# Patient Record
Sex: Male | Born: 1965 | Race: White | Hispanic: No | Marital: Single | Smoking: Light tobacco smoker
Health system: Southern US, Community
[De-identification: ages and names within clinical notes are randomized; demographics above are authoritative.]

## PROBLEM LIST (undated history)

## (undated) DIAGNOSIS — F1910 Other psychoactive substance abuse, uncomplicated: Secondary | ICD-10-CM

## (undated) DIAGNOSIS — F1010 Alcohol abuse, uncomplicated: Secondary | ICD-10-CM

## (undated) DIAGNOSIS — M542 Cervicalgia: Secondary | ICD-10-CM

## (undated) DIAGNOSIS — S70321A Blister (nonthermal), right thigh, initial encounter: Secondary | ICD-10-CM

## (undated) DIAGNOSIS — L089 Local infection of the skin and subcutaneous tissue, unspecified: Secondary | ICD-10-CM

## (undated) HISTORY — PX: APPENDECTOMY: SHX54

---

## 2008-12-12 ENCOUNTER — Emergency Department (HOSPITAL_COMMUNITY): Admission: EM | Admit: 2008-12-12 | Discharge: 2008-12-12 | Payer: Self-pay | Admitting: Emergency Medicine

## 2011-03-22 LAB — CBC
MCHC: 32.7 g/dL (ref 30.0–36.0)
MCV: 98.5 fL (ref 78.0–100.0)
Platelets: 295 10*3/uL (ref 150–400)

## 2011-03-22 LAB — BASIC METABOLIC PANEL
BUN: 10 mg/dL (ref 6–23)
CO2: 29 mEq/L (ref 19–32)
Chloride: 105 mEq/L (ref 96–112)
Creatinine, Ser: 1.11 mg/dL (ref 0.4–1.5)

## 2011-03-22 LAB — DIFFERENTIAL
Basophils Relative: 0 % (ref 0–1)
Eosinophils Absolute: 0.2 10*3/uL (ref 0.0–0.7)
Eosinophils Relative: 3 % (ref 0–5)
Monocytes Relative: 12 % (ref 3–12)
Neutrophils Relative %: 39 % — ABNORMAL LOW (ref 43–77)

## 2013-11-23 ENCOUNTER — Encounter (HOSPITAL_COMMUNITY): Payer: Self-pay | Admitting: Emergency Medicine

## 2013-11-23 ENCOUNTER — Emergency Department (HOSPITAL_COMMUNITY): Payer: Self-pay

## 2013-11-23 ENCOUNTER — Emergency Department (HOSPITAL_COMMUNITY)
Admission: EM | Admit: 2013-11-23 | Discharge: 2013-11-23 | Disposition: A | Discharge status: Home / Self Care | Payer: Self-pay | Attending: Emergency Medicine | Admitting: Emergency Medicine

## 2013-11-23 DIAGNOSIS — R42 Dizziness and giddiness: Secondary | ICD-10-CM

## 2013-11-23 DIAGNOSIS — M542 Cervicalgia: Secondary | ICD-10-CM

## 2013-11-23 DIAGNOSIS — R209 Unspecified disturbances of skin sensation: Secondary | ICD-10-CM | POA: Insufficient documentation

## 2013-11-23 DIAGNOSIS — M4802 Spinal stenosis, cervical region: Secondary | ICD-10-CM | POA: Insufficient documentation

## 2013-11-23 DIAGNOSIS — F17200 Nicotine dependence, unspecified, uncomplicated: Secondary | ICD-10-CM | POA: Insufficient documentation

## 2013-11-23 HISTORY — DX: Cervicalgia: M54.2

## 2013-11-23 LAB — URINALYSIS, ROUTINE W REFLEX MICROSCOPIC
Bilirubin Urine: NEGATIVE
Hgb urine dipstick: NEGATIVE
Ketones, ur: NEGATIVE mg/dL
Nitrite: NEGATIVE
Protein, ur: NEGATIVE mg/dL
Specific Gravity, Urine: 1.022 (ref 1.005–1.030)
Urobilinogen, UA: 0.2 mg/dL (ref 0.0–1.0)

## 2013-11-23 LAB — CBC
MCH: 33.2 pg (ref 26.0–34.0)
Platelets: 336 10*3/uL (ref 150–400)
RBC: 4.52 MIL/uL (ref 4.22–5.81)
RDW: 12.4 % (ref 11.5–15.5)
WBC: 9.2 10*3/uL (ref 4.0–10.5)

## 2013-11-23 LAB — BASIC METABOLIC PANEL
CO2: 27 mEq/L (ref 19–32)
Calcium: 9.5 mg/dL (ref 8.4–10.5)
GFR calc Af Amer: >90 mL/min (ref >90)
Sodium: 140 mEq/L (ref 135–145)

## 2013-11-23 LAB — POCT I-STAT TROPONIN I: Troponin i, poc: 0 ng/mL (ref 0.00–0.08)

## 2013-11-23 MED ORDER — ONDANSETRON HCL 4 MG/2ML IJ SOLN
4.0000 mg | Freq: Once | INTRAMUSCULAR | Status: AC
  Administered 2013-11-23: 4 mg via INTRAVENOUS
  Filled 2013-11-23: qty 2

## 2013-11-23 MED ORDER — MORPHINE SULFATE 4 MG/ML IJ SOLN
4.0000 mg | Freq: Once | INTRAMUSCULAR | Status: AC
  Administered 2013-11-23: 4 mg via INTRAVENOUS
  Filled 2013-11-23: qty 1

## 2013-11-23 MED ORDER — GADOBENATE DIMEGLUMINE 529 MG/ML IV SOLN
15.0000 mL | Freq: Once | INTRAVENOUS | Status: AC
  Administered 2013-11-23: 15 mL via INTRAVENOUS

## 2013-11-23 MED ORDER — PROMETHAZINE HCL 25 MG PO TABS: 25.0000 mg | ORAL_TABLET | Freq: Four times a day (QID) | ORAL | Status: DC | PRN

## 2013-11-23 MED ORDER — OXYCODONE-ACETAMINOPHEN 5-325 MG PO TABS
2.0000 | ORAL_TABLET | Freq: Once | ORAL | Status: AC
  Administered 2013-11-23: 2 via ORAL
  Filled 2013-11-23: qty 2

## 2013-11-23 MED ORDER — DIAZEPAM 5 MG PO TABS: 5.0000 mg | ORAL_TABLET | Freq: Three times a day (TID) | ORAL | Status: DC | PRN

## 2013-11-23 MED ORDER — DIAZEPAM 5 MG/ML IJ SOLN
5.0000 mg | Freq: Once | INTRAMUSCULAR | Status: AC
  Administered 2013-11-23: 5 mg via INTRAVENOUS
  Filled 2013-11-23: qty 2

## 2013-11-23 MED ORDER — OXYCODONE-ACETAMINOPHEN 5-325 MG PO TABS: 1.0000 | ORAL_TABLET | ORAL | Status: DC | PRN

## 2013-11-23 MED ORDER — SODIUM CHLORIDE 0.9 % IV BOLUS (SEPSIS)
1000.0000 mL | Freq: Once | INTRAVENOUS | Status: AC
  Administered 2013-11-23: 1000 mL via INTRAVENOUS

## 2013-11-23 NOTE — ED Notes (Signed)
Spoke with MRI about delay- pt informed he would be transported in about 20 minutes.  Pt denies being claustrophobic.

## 2013-11-23 NOTE — ED Notes (Signed)
Pt reports for the past couple of weeks he has been having dizzy spells. Reports that he feels like his balance is off. No neuro deficits. Pt A&Ox4. Denies any new pain at this time.

## 2013-11-23 NOTE — ED Notes (Signed)
Neurology at bedside.

## 2013-11-23 NOTE — ED Notes (Signed)
Pt comfortable with d/c and f/u instructions. Prescriptions x3 

## 2013-11-23 NOTE — ED Provider Notes (Signed)
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 He states over the past 2 weeks he is also had intermittent vertiginous symptoms and feels it is very difficult for him to walk.  He states he has had intermittent hearing changes but denies urine loss, ear pain or discharge from his ears. Denies any recent head injury. He does have some intermittent numbness in both of his arms but this is mostly worse at night. No weakness. No bowel or bladder incontinence. He states it is very difficult for him to walk because of his symptoms. He states that his neck pain and vertigo are worse with rotation of his neck and extending his neck. He denies any fever or chills. He's had a diffuse, throbbing headache. No history of head injury. He is not on anticoagulation. He denies any chest pain, shortness of breath, palpitations. No lightheadedness. No vomiting or diarrhea. No bloody stool or melena.  ROS: See HPI Constitutional: no fever  Eyes: no drainage  ENT: no runny nose   Cardiovascular:  no chest pain  Resp: no SOB  GI: no vomiting GU: no dysuria Integumentary: no rash  Allergy: no hives  Musculoskeletal: no leg swelling  Neurological: no slurred speech ROS otherwise negative  PAST MEDICAL HISTORY/PAST SURGICAL HISTORY:  Past Medical History  Diagnosis Date  . Neck pain     MEDICATIONS:  Prior to Admission medications   Not on File    ALLERGIES:  No Known Allergies  SOCIAL HISTORY:  History  Substance Use Topics  . Smoking status: Current Every Day Smoker  . Smokeless tobacco: Not on file  . Alcohol Use: Yes    FAMILY HISTORY: History reviewed. No pertinent family history.  EXAM: BP 133/100  Pulse 52  Temp(Src) 98.1 F (36.7 C)  (Oral)  Resp 16  Ht 5\' 10"  (1.778 m)  Wt 201 lb (91.173 kg)  BMI 28.84 kg/m2  SpO2 99% CONSTITUTIONAL: Alert and oriented and responds appropriately to questions. Well-appearing; well-nourished HEAD: Normocephalic EYES: Conjunctivae clear, PERRL ENT: normal nose; no rhinorrhea; moist mucous membranes; pharynx without lesions noted, TMs are clear bilaterally NECK: Supple, no meningismus, no LAD; tender to palpation over his posterior neck diffusely with, no midline step-off or deformity, no carotid bruit CARD: RRR; S1 and S2 appreciated; no murmurs, no clicks, no rubs, no gallops RESP: Normal chest excursion without splinting or tachypnea; breath sounds clear and equal bilaterally; no wheezes, no rhonchi, no rales,  ABD/GI: Normal bowel sounds; non-distended; soft, non-tender, no rebound, no guarding BACK:  The back appears normal and is non-tender to palpation, there is no CVA tenderness EXT: Normal ROM in all joints; non-tender to palpation; no edema; normal capillary refill; no cyanosis    SKIN: Normal color for age and race; warm NEURO: Moves all extremities equally; nystagmus with horizontal eye movements that slowly fatigues after several minutes and is worse when looking towards the right, stumbling but not ataxic gait, strength 5/5 in all 4 extremities, sensation to light touch intact diffusely, diminished reflexes in bilateral upper and lower extremities, no clonus PSYCH: The patient's mood and manner are appropriate. Grooming and personal hygiene are appropriate.  MEDICAL DECISION MAKING: Patient here with vertiginous symptoms and neck pain. Symptoms may be consistent with  peripheral vertigo however given patient's not consistent neuro exam, will obtain MRI of his brain, cervical spine and MRA of his neck. Discussed with radiologist who agrees with these tests to rule out any vascular abnormality, cervical myelopathy, infarct. Patient's labs are unremarkable. Troponin negative. EKG  normal. Will give IV fluids, Zofran, Valium and reassess. Patient and family at bedside agree with this plan.  ED PROGRESS: Patient's MRA neck is unremarkable. His MRI brain shows nonspecific scattered punctate subcortical T2 hyperintensities that may be due to microvascular changes versus prior inflammatory infectious changes, possible multiple sclerosis, vasculitis, complicated migraine headaches. 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 Given he initially had some stumbling gait on exam and has this moderate stenosis, will discuss with neurosurgery on call. After receiving medications his gait has slightly improved. It is difficult to ascertain whether his gait abnormalities were from any possible cervical spine pathology versus his vertigo but he does have normal reflexes to slightly diminished reflexes on exam. There is no hyperreflexia or clonus.  Discussed with Dr. Venetia Maxon with neurosurgery who will follow the patient in his office. Also discussed with Dr. Cyril Mourning with neurology who will see the patient in the ED.  10:53 PM  Dr. Leroy Kennedy has seen the patient in the emergency department and feels his symptoms and imaging are inconsistent with multiple sclerosis. Recommends repeat outpatient MRI in 3 months. We'll discharge home with pain medication, Valium and Phenergan for vertigo. Given return precautions. Will give PCP followup information. Will give neurology and neurosurgery outpatient information. Patient verbalizes understanding and is comfortable with plan.    EKG Interpretation    Date/Time:  Friday November 23 2013 13:20:32 EST Ventricular Rate:  49 PR Interval:  180 QRS Duration: 94 QT Interval:  474 QTC Calculation: 428 R Axis:   72 Text Interpretation:  Sinus bradycardia Otherwise normal ECG No significant change since last tracing Confirmed by Jonael Paradiso  DO, Holden Maniscalco ([id number]) on 11/23/2013 3:17:40 PM                Layla Maw Rydge Texidor, DO 11/23/13 [id number]

## 2013-11-23 NOTE — ED Notes (Signed)
Patient transported to MRI 

## 2013-11-23 NOTE — Discharge Instructions (Signed)
Spinal Stenosis Spinal stenosis is an abnormal narrowing of the canals of your spine (vertebrae). CAUSES  Spinal stenosis is caused by areas of bone pushing into the central canals of your vertebrae. This condition can be present at birth (congenital). It also may be caused by arthritic deterioration of your vertebrae (spinal degeneration).  SYMPTOMS   Pain that is generally worse with activities, particularly standing and walking.  Numbness, tingling, hot or cold sensations, weakness, or weariness in your legs.  Frequent episodes of falling.  A foot-slapping gait that leads to muscle weakness. DIAGNOSIS  Spinal stenosis is diagnosed with the use of magnetic resonance imaging (MRI) or computed tomography (CT). TREATMENT  Initial therapy for spinal stenosis focuses on the management of the pain and other symptoms associated with the condition. These therapies include:  Practicing postural changes to lessen pressure on your nerves.  Exercises to strengthen the core of your body.  Loss of excess body weight.  The use of nonsteroidal anti-inflammatory medications to reduce swelling and inflammation in your nerves. When therapies to manage pain are not successful, surgery to treat spinal stenosis may be recommended. This surgery involves removing excess bone, which puts pressure on your nerve roots. During this surgery (laminectomy), the posterior boney arch (lamina) and excess bone around the facet joints are removed. Document Released: 02/12/2004 Document Revised: 03/19/2013 Document Reviewed: 03/02/2013 [facility] Patient Information 2014 [place], [place].  Vertigo Vertigo means you feel like you or your surroundings are moving when they are not. Vertigo can be dangerous if it occurs when you are at work, driving, or performing difficult activities.  CAUSES  Vertigo occurs when there is a conflict of signals sent to your brain from the visual and sensory systems in your body. There are  many different causes of vertigo, including:  Infections, especially in the inner ear.  A bad reaction to a drug or misuse of alcohol and medicines.  Withdrawal from drugs or alcohol.  Rapidly changing positions, such as lying down or rolling over in bed.  A migraine headache.  Decreased blood flow to the brain.  Increased pressure in the brain from a head injury, infection, tumor, or bleeding. SYMPTOMS  You may feel as though the world is spinning around or you are falling to the ground. Because your balance is upset, vertigo can cause nausea and vomiting. You may have involuntary eye movements (nystagmus). DIAGNOSIS  Vertigo is usually diagnosed by physical exam. If the cause of your vertigo is unknown, your caregiver may perform imaging tests, such as an MRI scan (magnetic resonance imaging). TREATMENT  Most cases of vertigo resolve on their own, without treatment. Depending on the cause, your caregiver may prescribe certain medicines. If your vertigo is related to body position issues, your caregiver may recommend movements or procedures to correct the problem. In rare cases, if your vertigo is caused by certain inner ear problems, you may need surgery. HOME CARE INSTRUCTIONS   Follow your caregiver's instructions.  Avoid driving.  Avoid operating heavy machinery.  Avoid performing any tasks that would be dangerous to you or others during a vertigo episode.  Tell your caregiver if you notice that certain medicines seem to be causing your vertigo. Some of the medicines used to treat vertigo episodes can actually make them worse in some people. SEEK IMMEDIATE MEDICAL CARE IF:   Your medicines do not relieve your vertigo or are making it worse.  You develop problems with talking, walking, weakness, or using your arms, hands, or legs.  You develop severe headaches.  Your nausea or vomiting continues or gets worse.  You develop visual changes.  A family member notices  behavioral changes.  Your condition gets worse. MAKE SURE YOU:  Understand these instructions.  Will watch your condition.  Will get help right away if you are not doing well or get worse. Document Released: 09/01/2005 Document Revised: 02/14/2012 Document Reviewed: 06/10/2011 [facility] Patient Information 2014 [place], [place].

## 2013-11-23 NOTE — Consult Note (Signed)
NEURO HOSPITALIST CONSULT NOTE    Reason for Consult: vertigo, dysequilibrium, neck pain  HPI:                                                                                                                                          Matthew Bryant is an 47 y.o. male with a past medical history significant for chronic episodic vertigo, chronic neck pain, comes in today for evaluation of the above stated symptoms. Matthew Bryant stated that he started having " dizziness" couple of years ago. He said that prior attacks of dizziness will last only for few days, but this time the dizziness has being of and ion for 3 weeks and got so severe today that he decided to come to the ED. He indicated that the dizziness is triggered by head/neck movement or changes in position and he gets very off balance during the attack. No nausea, vomiting, double vision, focal weakness, slurred speech, confusion, language or vision impairment. No bladder or bowel impairment, muscle cramps. He complains of daily, severe neck pain that travels to his arms and is also associated with numbness in the same distribution. No recent head or neck trauma, fever, skin rash, or infection. Matthew Bryant said that at some point in the past he did therapy that helped the vertigo. His symptoms prompted MRI brain tonight that showed non specific scattered punctate subcortical T2 hyperintensities are slightly  MRA neck unremarkable. 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    Past Medical History  Diagnosis Date  . Neck pain     History reviewed. No pertinent past surgical history.  History reviewed. No pertinent family history.   Social History:  reports that he has been smoking.  He does not have any smokeless tobacco history on file. He reports that he drinks alcohol. He reports that he uses illicit drugs (Marijuana).  No Known Allergies  MEDICATIONS:                                                                                                                      I have reviewed the patient's current medications.   ROS:  History obtained from the patient  General ROS: negative for - chills, fatigue, fever, night sweats, weight gain or weight loss Psychological ROS: negative for - behavioral disorder, hallucinations, memory difficulties, mood swings or suicidal ideation Ophthalmic ROS: negative for - blurry vision, double vision, eye pain or loss of vision ENT ROS: negative for - epistaxis, nasal discharge, oral lesions, sore throat, tinnitus or vertigo Allergy and Immunology ROS: negative for - hives or itchy/watery eyes Hematological and Lymphatic ROS: negative for - bleeding problems, bruising or swollen lymph nodes Endocrine ROS: negative for - galactorrhea, hair pattern changes, polydipsia/polyuria or temperature intolerance Respiratory ROS: negative for - cough, hemoptysis, shortness of breath or wheezing Cardiovascular ROS: negative for - chest pain, dyspnea on exertion, edema or irregular heartbeat Gastrointestinal ROS: negative for - abdominal pain, diarrhea, hematemesis, nausea/vomiting or stool incontinence Genito-Urinary ROS: negative for - dysuria, hematuria, incontinence or urinary frequency/urgency Musculoskeletal ROS: negative for - joint swelling or muscular weakness Neurological ROS: as noted in HPI Dermatological ROS: negative for rash and skin lesion changes   Physical exam: pleasant male in no apparent distress. Blood pressure 120/82, pulse 57, temperature 98.1 F (36.7 C), temperature source Oral, resp. rate 18, height 5\' 10"  (1.778 m), weight 91.173 kg (201 lb), SpO2 99.00%. Head: normocephalic. Neck: tender, decreased range of motion, no bruits, no JVD. Cardiac: no murmurs. Lungs:  clear. Abdomen: soft, no tender, no mass. Extremities: no edema.   Neurologic Examination:                                                                                                      Mental Status: Alert, oriented, thought content appropriate.  Speech fluent without evidence of aphasia.  Able to follow 3 step commands without difficulty. Cranial Nerves: II: Discs flat bilaterally; Visual fields grossly normal, pupils equal, round, reactive to light and accommodation III,IV, VI: ptosis not present, extra-ocular motions intact bilaterally V,VII: smile symmetric, facial light touch sensation normal bilaterally VIII: hearing normal bilaterally IX,X: gag reflex present XI: bilateral shoulder shrug XII: midline tongue extension without atrophy or fasciculations  Motor: Right : Upper extremity   5/5    Left:     Upper extremity   5/5  Lower extremity   5/5     Lower extremity   5/5 Tone and bulk:normal tone throughout; no atrophy noted Sensory: Pinprick and light touch intact throughout, bilaterally Deep Tendon Reflexes:  Right: Upper Extremity   Left: Upper extremity   biceps (C-5 to C-6) 2/4   biceps (C-5 to C-6) 2/4 tricep (C7) 2/4    triceps (C7) 2/4 Brachioradialis (C6) 2/4  Brachioradialis (C6) 2/4  Lower Extremity Lower Extremity  quadriceps (L-2 to L-4) 2/4   quadriceps (L-2 to L-4) 2/4 Achilles (S1) 2/4   Achilles (S1) 2/4  Plantars: Right: downgoing   Left: downgoing Cerebellar: normal finger-to-nose,  normal heel-to-shin test Gait:  No ataxia. CV: pulses palpable throughout    No results found for this basename: cbc, bmp, coags, chol, tri, ldl, hga1c    Results for orders placed during the hospital encounter of  11/23/13 (from the past 48 hour(s))  CBC     Status: None   Collection Time    11/23/13  1:27 PM      Result Value Range   WBC 9.2  4.0 - 10.5 K/uL   RBC 4.52  4.22 - 5.81 MIL/uL   Hemoglobin 15.0  13.0 - 17.0 g/dL   HCT 16.1  09.6 - 04.5 %    MCV 96.9  78.0 - 100.0 fL   MCH 33.2  26.0 - 34.0 pg   MCHC 34.2  30.0 - 36.0 g/dL   RDW 40.9  81.1 - 91.4 %   Platelets 336  150 - 400 K/uL  BASIC METABOLIC PANEL     Status: Abnormal   Collection Time    11/23/13  1:27 PM      Result Value Range   Sodium 140  135 - 145 mEq/L   Potassium 4.0  3.5 - 5.1 mEq/L   Chloride 104  96 - 112 mEq/L   CO2 27  19 - 32 mEq/L   Glucose, Bld 80  70 - 99 mg/dL   BUN 11  6 - 23 mg/dL   Creatinine, Ser 7.82  0.50 - 1.35 mg/dL   Calcium 9.5  8.4 - 95.6 mg/dL   GFR calc non Af Amer 83 (*) >90 mL/min   GFR calc Af Amer >90  >90 mL/min   Comment: (NOTE)     The eGFR has been calculated using the CKD EPI equation.     This calculation has not been validated in all clinical situations.     eGFR's persistently <90 mL/min signify possible Chronic Kidney     Disease.  POCT I-STAT TROPONIN I     Status: None   Collection Time    11/23/13  1:38 PM      Result Value Range   Troponin i, poc 0.00  0.00 - 0.08 ng/mL   Comment 3            Comment: Due to the release kinetics of cTnI,     a negative result within the first hours     of the onset of symptoms does not rule out     myocardial infarction with certainty.     If myocardial infarction is still suspected,     repeat the test at appropriate intervals.  URINALYSIS, ROUTINE W REFLEX MICROSCOPIC     Status: None   Collection Time    11/23/13  7:21 PM      Result Value Range   Color, Urine YELLOW  YELLOW   APPearance CLEAR  CLEAR   Specific Gravity, Urine 1.022  1.005 - 1.030   pH 6.0  5.0 - 8.0   Glucose, UA NEGATIVE  NEGATIVE mg/dL   Hgb urine dipstick NEGATIVE  NEGATIVE   Bilirubin Urine NEGATIVE  NEGATIVE   Ketones, ur NEGATIVE  NEGATIVE mg/dL   Protein, ur NEGATIVE  NEGATIVE mg/dL   Urobilinogen, UA 0.2  0.0 - 1.0 mg/dL   Nitrite NEGATIVE  NEGATIVE   Leukocytes, UA NEGATIVE  NEGATIVE   Comment: MICROSCOPIC NOT DONE ON URINES WITH NEGATIVE PROTEIN, BLOOD, LEUKOCYTES, NITRITE, OR GLUCOSE  <1000 mg/dL.    Mr Angiogram Neck W Wo Contrast  11/23/2013   CLINICAL DATA:  Neck pain and radiculopathy. Vertigo. Pre syncopal sensation. CT head without contrast  EXAM: MRI HEAD WITHOUT AND WITH CONTRAST  MRA NECK WITHOUT AND WITH CONTRAST  TECHNIQUE: Multiplanar, multiecho pulse sequences of the brain and  surrounding structures were obtained without and with intravenous contrast. Angiographic images of the neck were obtained using MRA technique without and with contrast.  CONTRAST:  15mL MULTIHANCE GADOBENATE DIMEGLUMINE 529 MG/ML IV SOLN  COMPARISON:  CT head 12/12/2008  FINDINGS: MRI HEAD FINDINGS  The diffusion-weighted images demonstrate no evidence for acute or subacute infarction. Midline structures are within normal limits. Scattered punctate subcortical T2 hyperintensities are slightly greater than anticipated for age. No hemorrhage or mass lesion is present. The ventricles are of normal size. No significant extra-axial fluid collection is present.  Flow is present in the major intracranial arteries. The globes and orbits are intact.  A fluid level is present in the right maxillary sinus. Circumferential mucosal thickening is noted in the left maxillary sinus. Scattered mucosal disease is present throughout the ethmoid air cells and bilateral frontal sinuses. The mastoid air cells are clear.  MRA NECK FINDINGS  The time-of-flight images demonstrate no significant flow disturbance at either carotid bifurcation. Flow is antegrade in the vertebral arteries bilaterally.  May postcontrast images demonstrate a standard 3 vessel arch configuration. The vertebral arteries originate from the subclavian arteries bilaterally. Signal loss at the origin of the right vertebral artery is felt to be artifactual. The left vertebral artery is slightly dominant. There are no focal stenoses.  The right common carotid artery is within normal limits. The bifurcation is unremarkable. The cervical right internal carotid  artery is normal.  The left common carotid artery is within normal limits. The bifurcation is unremarkable. The cervical left ICA is normal.  IMPRESSION: 1. No acute intracranial abnormality. 2. Scattered subcortical T2 hyperintensities are slightly greater than expected for age. The finding is nonspecific but can be seen in the setting of chronic microvascular ischemia, a demyelinating process such as multiple sclerosis, vasculitis, complicated migraine headaches, or as the sequelae of a prior infectious or inflammatory process. 3. Diffuse sinus disease with a fluid level in the right maxillary sinus. 4. Negative MRA of the neck.   Electronically Signed   By: Gennette Pac M.D.   On: 11/23/2013 19:00   Mr Laqueta Jean ZO Contrast  11/23/2013   CLINICAL DATA:  Neck pain and radiculopathy. Vertigo. Pre syncopal sensation. CT head without contrast  EXAM: MRI HEAD WITHOUT AND WITH CONTRAST  MRA NECK WITHOUT AND WITH CONTRAST  TECHNIQUE: Multiplanar, multiecho pulse sequences of the brain and surrounding structures were obtained without and with intravenous contrast. Angiographic images of the neck were obtained using MRA technique without and with contrast.  CONTRAST:  15mL MULTIHANCE GADOBENATE DIMEGLUMINE 529 MG/ML IV SOLN  COMPARISON:  CT head 12/12/2008  FINDINGS: MRI HEAD FINDINGS  The diffusion-weighted images demonstrate no evidence for acute or subacute infarction. Midline structures are within normal limits. Scattered punctate subcortical T2 hyperintensities are slightly greater than anticipated for age. No hemorrhage or mass lesion is present. The ventricles are of normal size. No significant extra-axial fluid collection is present.  Flow is present in the major intracranial arteries. The globes and orbits are intact.  A fluid level is present in the right maxillary sinus. Circumferential mucosal thickening is noted in the left maxillary sinus. Scattered mucosal disease is present throughout the ethmoid air  cells and bilateral frontal sinuses. The mastoid air cells are clear.  MRA NECK FINDINGS  The time-of-flight images demonstrate no significant flow disturbance at either carotid bifurcation. Flow is antegrade in the vertebral arteries bilaterally.  May postcontrast images demonstrate a standard 3 vessel arch configuration. The vertebral arteries originate  from the subclavian arteries bilaterally. Signal loss at the origin of the right vertebral artery is felt to be artifactual. The left vertebral artery is slightly dominant. There are no focal stenoses.  The right common carotid artery is within normal limits. The bifurcation is unremarkable. The cervical right internal carotid artery is normal.  The left common carotid artery is within normal limits. The bifurcation is unremarkable. The cervical left ICA is normal.  IMPRESSION: 1. No acute intracranial abnormality. 2. Scattered subcortical T2 hyperintensities are slightly greater than expected for age. The finding is nonspecific but can be seen in the setting of chronic microvascular ischemia, a demyelinating process such as multiple sclerosis, vasculitis, complicated migraine headaches, or as the sequelae of a prior infectious or inflammatory process. 3. Diffuse sinus disease with a fluid level in the right maxillary sinus. 4. Negative MRA of the neck.   Electronically Signed   By: Gennette Pac M.D.   On: 11/23/2013 19:00   Mr Cervical Spine Wo Contrast  11/23/2013   CLINICAL DATA:  Neck pain and right radiculopathy.  EXAM: MRI CERVICAL SPINE WITHOUT CONTRAST  TECHNIQUE: Multiplanar, multisequence MR imaging was performed. No intravenous contrast was administered.  COMPARISON:  Normal signal is present on upper thoracic spinal cord to the lowest imaged level, T2-3. Marrow signal is slightly heterogeneous. Vertebral body heights and alignment are maintained. The craniocervical junction is within normal limits. Flow is present in the major vascular structures  of the neck.  C2-3:  Negative.  C3-4: Uncovertebral spurring is present bilaterally. Mild facet hypertrophy is noted as well. This results in mild foraminal narrowing on both sides.  C4-5: A broad-based disc osteophyte complex is asymmetric to the right. Asymmetric right-sided facet hypertrophy is present. Mild right central and moderate right foraminal stenosis is present.  C5-6: A broad-based disc osteophyte complex is present. The central canal is narrowed to 7.5 mm. Severe left and moderate right foraminal stenosis is secondary to uncovertebral and facet disease.  C6-7: A broad-based disc osteophyte complex partially effaces the ventral CSF. The canal is maintained at 9.5 mm. Mild to moderate left foraminal narrowing is due to uncovertebral and facet disease. Facet hypertrophy is present on the right without significant stenosis.  C7-T1: Mild facet hypertrophy is present bilaterally without significant stenosis.  FINDINGS: 1. Mild bilateral foraminal stenosis at C3-4. 2. Mild right central and moderate right foraminal stenosis at C4-5. 3. Moderate central canal stenosis at C5-6. 4. Severe left and moderate right foraminal stenosis at C5-6. 5. Mild to moderate left foraminal stenosis at C6-7. 6. Facet hypertrophy on the right at C6-7 and bilaterally at C7-T1 without significant stenosis.   Electronically Signed   By: Gennette Pac M.D.   On: 11/23/2013 19:04    Assessment/Plan: 47 y/o with chronic episodic isolated vertigo and non focal neuro-exam. MRI brain showed scattered but non specific subcortical T2 hyperintensities that do not meet criteria for demyelinating disease. I think this should be follow by MRI brain with and without contrast in 3 months and then decide about LP, but at this moment his clinical presentation and MRI findings are not consistent with MS and most likely are due to a peripheral vertigo. Neurosurgery was already contacted by the ED regarding his neck issues and I concur that no  aggressive intervention is needed at this moment.   Wyatt Portela, MD 11/23/2013, 10:00 PM [facility]

## 2013-11-23 NOTE — ED Notes (Addendum)
Wife states the pt is feeling worse and feels he may pass out. I told her i would come to check the pt VS and she refused. States "im keeping an eye on him but i hope hell see the doctor soon." explained triage process and wait to wife, she verbalized udnerstanding

## 2015-02-20 ENCOUNTER — Emergency Department (HOSPITAL_COMMUNITY)
Admission: EM | Admit: 2015-02-20 | Discharge: 2015-02-20 | Disposition: A | Discharge status: Home / Self Care | Payer: Self-pay | Attending: Emergency Medicine | Admitting: Emergency Medicine

## 2015-02-20 ENCOUNTER — Encounter (HOSPITAL_COMMUNITY): Payer: Self-pay

## 2015-02-20 DIAGNOSIS — K029 Dental caries, unspecified: Secondary | ICD-10-CM | POA: Insufficient documentation

## 2015-02-20 DIAGNOSIS — Z79899 Other long term (current) drug therapy: Secondary | ICD-10-CM | POA: Insufficient documentation

## 2015-02-20 DIAGNOSIS — K047 Periapical abscess without sinus: Secondary | ICD-10-CM | POA: Insufficient documentation

## 2015-02-20 DIAGNOSIS — Z720 Tobacco use: Secondary | ICD-10-CM | POA: Insufficient documentation

## 2015-02-20 MED ORDER — DOXYCYCLINE HYCLATE 100 MG PO CAPS: 100.0000 mg | ORAL_CAPSULE | Freq: Two times a day (BID) | ORAL | Status: DC

## 2015-02-20 MED ORDER — HYDROCODONE-ACETAMINOPHEN 5-325 MG PO TABS: 1.0000 | ORAL_TABLET | Freq: Four times a day (QID) | ORAL | Status: DC | PRN

## 2015-02-20 MED ORDER — HYDROCODONE-ACETAMINOPHEN 5-325 MG PO TABS
1.0000 | ORAL_TABLET | Freq: Once | ORAL | Status: AC
  Administered 2015-02-20: 1 via ORAL
  Filled 2015-02-20: qty 1

## 2015-02-20 MED ORDER — BENZOCAINE 20 % MT PSTE
1.0000 "application " | PASTE | Freq: Once | OROMUCOSAL | Status: AC
  Administered 2015-02-20: 1 via OROMUCOSAL
  Filled 2015-02-20: qty 11.9

## 2015-02-20 MED ORDER — NAPROXEN 500 MG PO TABS: 500.0000 mg | ORAL_TABLET | Freq: Two times a day (BID) | ORAL | Status: DC | PRN

## 2015-02-20 NOTE — ED Provider Notes (Signed)
CSN: [id number]     Arrival date & time 02/20/15  1147 History  This chart was scribed for non-physician practitioner, Allen Derry, PA-C, working with Tilden Fossa, MD, by Ronney Lion, ED Scribe. This patient was seen in room WTR5/WTR5 and the patient's care was started at 12:39 PM.    Chief Complaint  Patient presents with  . Dental Pain   Patient is a 49 y.o. male presenting with tooth pain. The history is provided by the patient, the spouse and medical records. No language interpreter was used.  Dental Pain Location:  Lower Lower teeth location:  20/LL 2nd bicuspid Quality:  Sharp (stabbing) Severity:  Severe (10/10) Onset quality:  Gradual Duration:  2 weeks Timing:  Constant Progression:  Worsening Chronicity:  New Context: abscess   Previous work-up:  Dental exam Relieved by:  Nothing Exacerbated by: cold air. Ineffective treatments:  Acetaminophen Associated symptoms: facial swelling (per wife, there is a knot in his left cheek), gum swelling and oral lesions (abscess)   Associated symptoms: no difficulty swallowing, no drooling, no fever, no headaches, no neck pain, no neck swelling and no trismus   Risk factors: smoking (per medical records, patient is a current smoker)      HPI Comments: Matthew Bryant is a 49 y.o. male who presents to the Emergency Department complaining of constant, 10/10, stabbing left lower dental pain radiating to his left ear that began 2 weeks ago. Patient states his pain is so severe that he tried to extract the tooth himself this morning. He took a partial course of leftover Amoxicillin, with an unknown expiry date, 5 days ago, which did not alleviate his pain. He called his dentist this morning, who told him he needed to see an oral Careers adviser. He has tried Tylenol, which was ineffective. Exposure to cold air makes it worse. Nothing makes it better. His wife also notes a "knot" in his left cheek/face. He denies any neck swelling, ear drainage,  gum drainage, drooling, trismus, fever, chest pain, SOB, abdominal pain, nausea, or vomiting.   Past Medical History  Diagnosis Date  . Neck pain    History reviewed. No pertinent past surgical history. History reviewed. No pertinent family history. History  Substance Use Topics  . Smoking status: Current Every Day Smoker    Types: Cigarettes  . Smokeless tobacco: Not on file  . Alcohol Use: Yes    Review of Systems  Constitutional: Negative for fever and chills.  HENT: Positive for dental problem, ear pain, facial swelling (per wife, there is a knot in his left cheek) and mouth sores (abscess). Negative for drooling, ear discharge, rhinorrhea, sore throat and trouble swallowing.   Respiratory: Negative for shortness of breath.   Cardiovascular: Negative for chest pain.  Gastrointestinal: Negative for nausea, vomiting and abdominal pain.  Musculoskeletal: Negative for neck pain and neck stiffness.  Skin: Negative for rash.  Allergic/Immunologic: Negative for immunocompromised state.  Neurological: Negative for weakness, numbness and headaches.   A complete 10 system review of systems was obtained and all systems are negative except as noted in the HPI and PMH.     Allergies  Review of patient's allergies indicates no known allergies.  Home Medications   Prior to Admission medications   Medication Sig Start Date End Date Taking? Authorizing Provider  ibuprofen (ADVIL,MOTRIN) 200 MG tablet Take 800 mg by mouth every 6 (six) hours as needed for moderate pain.   Yes Historical Provider, MD  B Complex Vitamins (VITAMIN B COMPLEX PO)  Take 1 tablet by mouth daily.    Historical Provider, MD  diazepam (VALIUM) 5 MG tablet Take 1 tablet (5 mg total) by mouth every 8 (eight) hours as needed for muscle spasms. Patient not taking: Reported on 02/20/2015 11/23/13   Kristen N Ward, DO  OVER THE COUNTER MEDICATION Take 1 capsule by mouth daily.    Historical Provider, MD  oxyCODONE  (ROXICODONE) 15 MG immediate release tablet Take 15 mg by mouth daily as needed for pain (neck pain).    Historical Provider, MD  oxyCODONE-acetaminophen (ROXICET) 5-325 MG per tablet Take 1-2 tablets by mouth every 4 (four) hours as needed for severe pain. Patient not taking: Reported on 02/20/2015 11/23/13   Kristen N Ward, DO  promethazine (PHENERGAN) 25 MG tablet Take 1 tablet (25 mg total) by mouth every 6 (six) hours as needed for nausea or vomiting. Patient not taking: Reported on 02/20/2015 11/23/13   Kristen N Ward, DO   BP 137/82 mmHg  Pulse 72  Temp(Src) 98.1 F (36.7 C) (Oral)  Resp 16  Ht 5\' 10"  (1.778 m)  Wt 200 lb (90.719 kg)  BMI 28.70 kg/m2  SpO2 99% Physical Exam  Constitutional: He is oriented to person, place, and time. Vital signs are normal. He appears well-developed and well-nourished.  Non-toxic appearance. No distress.  Afebrile, nontoxic, NAD  HENT:  Head: Normocephalic and atraumatic.  Right Ear: Hearing, tympanic membrane, external ear and ear canal normal.  Left Ear: Hearing, tympanic membrane, external ear and ear canal normal.  Nose: Nose normal.  Mouth/Throat: Uvula is midline, oropharynx is clear and moist and mucous membranes are normal. No trismus in the jaw. Abnormal dentition. Dental abscesses and dental caries present. No uvula swelling.    TTP over L lower tooth #20 with abscess surrounding the area and another abscess located in the area where tooth #19 was previously located (no tooth present), bruising noted, with poor dentitia throughout. No trismus or drooling, oropharynx clear, ears clear bilaterally, nose clear. Mild facial swelling to L lower jaw.   Eyes: Conjunctivae and EOM are normal. Right eye exhibits no discharge. Left eye exhibits no discharge.  Neck: Normal range of motion. Neck supple.  Cardiovascular: Normal rate.   Pulmonary/Chest: Effort normal. No respiratory distress.  Abdominal: Normal appearance. He exhibits no distension.   Musculoskeletal: Normal range of motion.  Neurological: He is alert and oriented to person, place, and time. He has normal strength. No sensory deficit.  Skin: Skin is warm, dry and intact. No rash noted.  Psychiatric: He has a normal mood and affect.  Nursing note and vitals reviewed.   ED Course  INCISION AND DRAINAGE Date/Time: 02/20/2015 1:23 PM Performed by: Allen DerryAMPRUBI-SOMS, Anmol Fleck Authorized by: Allen DerryAMPRUBI-SOMS, Aunya Lemler Consent: Verbal consent obtained. Risks and benefits: risks, benefits and alternatives were discussed Consent given by: patient Patient understanding: patient states understanding of the procedure being performed Patient consent: the patient's understanding of the procedure matches consent given Patient identity confirmed: verbally with patient Type: abscess Body area: mouth Location details: alveolar process Local anesthetic: topical anesthetic Patient sedated: no Needle gauge: 18 Incision type: needle. Complexity: simple Drainage: purulent Drainage amount: scant Wound treatment: wound left open Patient tolerance: Patient tolerated the procedure well with no immediate complications Comments: Needle aspiration with 18G needle to dental abscess   (including critical care time)  DIAGNOSTIC STUDIES: Oxygen Saturation is 99% on room air, normal by my interpretation.    COORDINATION OF CARE: 12:42 PM - Discussed treatment plan with pt  at bedside which includes antibiotics, and pt agreed to plan. Do not see a good reason to perform an I&D at this time.  MDM   Final diagnoses:  Dental abscess  Pain due to dental caries    49 y.o. male here with Dental pain associated with dental infection and dental abscess with patient afebrile, non toxic appearing and swallowing secretions well. Abscess drained with needle aspiration, wound left opened. Discussed warm salt water swishes to help continue drainage. I gave patient referral to oral surgeon and stressed the  importance of dental follow up for ultimate management of dental pain.  I have also discussed reasons to return immediately to the ER.  Patient expresses understanding and agrees with plan.  I will also give doxycycline and pain control.    I personally performed the services described in this documentation, which was scribed in my presence. The recorded information has been reviewed and is accurate.  BP 137/82 mmHg  Pulse 72  Temp(Src) 98.1 F (36.7 C) (Oral)  Resp 16  Ht  (1.778 m)  Wt 200 lb (90.719 kg)  BMI 28.70 kg/m2  SpO2 99%  Meds ordered this encounter  Medications  . HYDROcodone-acetaminophen (NORCO/VICODIN) 5-325 MG per tablet 1 tablet    Sig:   . benzocaine (ORABASE-B) 20 % paste 1 application    Sig:   . doxycycline (VIBRAMYCIN) 100 MG capsule    Sig: Take 1 capsule (100 mg total) by mouth 2 (two) times daily. One po bid x 7 days    Dispense:  14 capsule    Refill:  0    Order Specific Question:  Supervising Provider    Answer:  MILLER, BRIAN [[id number]]  . naproxen (NAPROSYN) 500 MG tablet    Sig: Take 1 tablet (500 mg total) by mouth 2 (two) times daily as needed for mild pain, moderate pain or headache (TAKE WITH MEALS.).    Dispense:  20 tablet    Refill:  0    Order Specific Question:  Supervising Provider    Answer:  MILLER, BRIAN [[id number]]  . HYDROcodone-acetaminophen (NORCO) 5-325 MG per tablet    Sig: Take 1 tablet by mouth every 6 (six) hours as needed for severe pain.    Dispense:  10 tablet    Refill:  0    Order Specific Question:  Supervising Provider    Answer:  Eber Hong [[id number]]     Matthew Kuba Camprubi-Soms, PA-C 02/20/15 1339  Tilden Fossa, MD 02/20/15 1350

## 2015-02-20 NOTE — Discharge Instructions (Signed)
Apply warm compresses to jaw throughout the day. Take antibiotic until finished and avoid direct sunlight. Take naprosyn and norco as directed, as needed for pain but do not drive or operate machinery with pain medication use. Followup with a dentist is very important for ongoing evaluation and management of recurrent dental pain. Call the oral surgeon today and try to get an appointment in the next 1-2 days for ongoing management of your abscess. Use the list below to find a dentist if the surgeon cannot get you in immediately. Return to emergency department for emergent changing or worsening symptoms.  Abscessed Tooth An abscessed tooth is an infection around your tooth. It may be caused by holes or damage to the tooth (cavity) or a dental disease. An abscessed tooth causes mild to very bad pain in and around the tooth. See your dentist right away if you have tooth or gum pain. HOME CARE  Take your medicine as told. Finish it even if you start to feel better.  Do not drive after taking pain medicine.  Rinse your mouth (gargle) often with salt water ( teaspoon salt in 8 ounces of warm water).  Do not apply heat to the outside of your face. GET HELP RIGHT AWAY IF:   You have a temperature by mouth above 102 F (38.9 C), not controlled by medicine.  You have chills and a very bad headache.  You have problems breathing or swallowing.  Your mouth will not open.  You develop puffiness (swelling) on the neck or around the eye.  Your pain is not helped by medicine.  Your pain is getting worse instead of better. MAKE SURE YOU:   Understand these instructions.  Will watch your condition.  Will get help right away if you are not doing well or get worse. Document Released: 05/10/2008 Document Revised: 02/14/2012 Document Reviewed: 03/02/2011 [facility] Patient Information 2015 [place], [place]. This information is not intended to replace advice given to you by your health care provider. Make  sure you discuss any questions you have with your health care provider.  Dental Care and Dentist Visits Dental care supports good overall health. Regular dental visits can also help you avoid dental pain, bleeding, infection, and other more serious health problems in the future. It is important to keep the mouth healthy because diseases in the teeth, gums, and other oral tissues can spread to other areas of the body. Some problems, such as diabetes, heart disease, and pre-term labor have been associated with poor oral health.  See your dentist every 6 months. If you experience emergency problems such as a toothache or broken tooth, go to the dentist right away. If you see your dentist regularly, you may catch problems early. It is easier to be treated for problems in the early stages.  WHAT TO EXPECT AT A DENTIST VISIT  Your dentist will look for many common oral health problems and recommend proper treatment. At your regular dental visit, you can expect:  Gentle cleaning of the teeth and gums. This includes scraping and polishing. This helps to remove the sticky substance around the teeth and gums (plaque). Plaque forms in the mouth shortly after eating. Over time, plaque hardens on the teeth as tartar. If tartar is not removed regularly, it can cause problems. Cleaning also helps remove stains.  Periodic X-rays. These pictures of the teeth and supporting bone will help your dentist assess the health of your teeth.  Periodic fluoride treatments. Fluoride is a natural mineral shown to  help strengthen teeth. Fluoride treatmentinvolves applying a fluoride gel or varnish to the teeth. It is most commonly done in children.  Examination of the mouth, tongue, jaws, teeth, and gums to look for any oral health problems, such as:  Cavities (dental caries). This is decay on the tooth caused by plaque, sugar, and acid in the mouth. It is best to catch a cavity when it is small.  Inflammation of the gums  caused by plaque buildup (gingivitis).  Problems with the mouth or malformed or misaligned teeth.  Oral cancer or other diseases of the soft tissues or jaws. KEEP YOUR TEETH AND GUMS HEALTHY For healthy teeth and gums, follow these general guidelines as well as your dentist's specific advice:  Have your teeth professionally cleaned at the dentist every 6 months.  Brush twice daily with a fluoride toothpaste.  Floss your teeth daily.  Ask your dentist if you need fluoride supplements, treatments, or fluoride toothpaste.  Eat a healthy diet. Reduce foods and drinks with added sugar.  Avoid smoking. TREATMENT FOR ORAL HEALTH PROBLEMS If you have oral health problems, treatment varies depending on the conditions present in your teeth and gums.  Your caregiver will most likely recommend good oral hygiene at each visit.  For cavities, gingivitis, or other oral health disease, your caregiver will perform a procedure to treat the problem. This is typically done at a separate appointment. Sometimes your caregiver will refer you to another dental specialist for specific tooth problems or for surgery. SEEK IMMEDIATE DENTAL CARE IF:  You have pain, bleeding, or soreness in the gum, tooth, jaw, or mouth area.  A permanent tooth becomes loose or separated from the gum socket.  You experience a blow or injury to the mouth or jaw area. Document Released: 08/04/2011 Document Revised: 02/14/2012 Document Reviewed: 08/04/2011 [facility] Patient Information 2015 [place], [place]. This information is not intended to replace advice given to you by your health care provider. Make sure you discuss any questions you have with your health care provider.  Emergency Department Resource Guide 1) Find a Doctor and Pay Out of Pocket Although you won't have to find out who is covered by your insurance plan, it is a good idea to ask around and get recommendations. You will then need to call the office and see if  the doctor you have chosen will accept you as a new patient and what types of options they offer for patients who are self-pay. Some doctors offer discounts or will set up payment plans for their patients who do not have insurance, but you will need to ask so you aren't surprised when you get to your appointment.  2) Contact Your Local Health Department Not all health departments have doctors that can see patients for sick visits, but many do, so it is worth a call to see if yours does. If you don't know where your local health department is, you can check in your phone book. The CDC also has a tool to help you locate your state's health department, and many state websites also have listings of all of their local health departments.  3) Find a Walk-in Clinic If your illness is not likely to be very severe or complicated, you may want to try a walk in clinic. These are popping up all over the country in pharmacies, drugstores, and shopping centers. They're usually staffed by nurse practitioners or physician assistants that have been trained to treat common illnesses and complaints. They're usually fairly quick and  inexpensive. However, if you have serious medical issues or chronic medical problems, these are probably not your best option.  No Primary Care Doctor: - Call Health Connect at  [phone] - they can help you locate a primary care doctor that  accepts your insurance, provides certain services, etc. - Physician Referral Service- [phone]  Chronic Pain Problems: Organization         Address  Phone   Notes  [facility]  [phone] Patients need to be referred by their primary care doctor.   Medication Assistance: Organization         Address  Phone   Notes  [facility] Medication [facility] [address] [place] [place], [place] [address] [phone] --Must be a resident of [facility] -- Must have NO insurance coverage whatsoever (no  Medicaid/ Medicare, etc.) -- The pt. MUST have a primary care doctor that directs their care regularly and follows them in the community   [facility]  [phone]   [place]  [phone]     Dental Care: Organization         Address  Phone  Notes  [facility] Department of [facility] [address] [place], [place] [phone] Accepts children up to age 26 who are enrolled in [place] or [place] Health Choice; pregnant women with a Medicaid card; and children who have applied for Medicaid or [place] Health Choice, but were declined, whose parents can pay a reduced fee at time of service.  [facility] Department of [facility]  [address], [place] [phone] Accepts children up to age 71 who are enrolled in [place] or [place] Health Choice; pregnant women with a Medicaid card; and children who have applied for Medicaid or  Health Choice, but were declined, whose parents can pay a reduced fee at time of service.  [facility]  [address] [place], [place] [phone] Patients are seen by appointment only. Walk-ins are not accepted. [facility] will see patients 54 years of age and older. Monday - Tuesday (8am-5pm) Most Wednesdays (8:30-5pm) $30 per visit, cash only  [facility]  [address], [facility] [phone] Patients are seen by appointment only. Walk-ins are not accepted. [facility] will see patients 70 years of age and older. One Wednesday Evening (Monthly: Volunteer Based).  $30 per visit, cash only  [employer]  [phone] for adults; Children under age 59, call Graduate Pediatric Dentistry at [phone]. Children aged 36-14, please call [phone] to request a pediatric application.  Dental services are provided in all areas of dental care including fillings, crowns and bridges, complete and partial dentures, implants, gum  treatment, root canals, and extractions. Preventive care is also provided. Treatment is provided to both adults and children. Patients are selected via a lottery and there is often a waiting list.   [facility] [address] [place]  [phone] www.drcivils.com   [facility] [address] [place], [place] [phone], Ext. [phone] Second and Fourth Thursday of each month, opens at 6:30 AM; Clinic ends at 9 AM.  Patients are seen on a first-come first-served basis, and a limited number are seen during each clinic.   [facility]  [address] [place], [place] [phone]   Eligibility Requirements You must have lived in [place], [place],  or [place] counties for at least the last three months.   You cannot be eligible for state or federal sponsored [place], including [employer], [place], or [employer].   You generally cannot be eligible for healthcare insurance through your employer.    How to apply: Eligibility screenings are held every Tuesday and Wednesday afternoon from 1:00 pm until 4:00 pm. You do not need an appointment for the interview!  [facility] [address] [place], [place] [phone]   [facility]  [phone]   [facility] Health Department  [phone]   [facility] Health Department  [phone]

## 2015-02-20 NOTE — ED Notes (Signed)
Pt reports dental pain that start 2 x weeks ago.  Pt reports trying to pull tooth out himself this morning.  Pt reports that his dentist Dr Anise Salvoeeves recommended he "go to an Transport planneroral surgeon and get it looked at today".  Pt reports taking Amoxicillin that he had at home from 02/09/15 - 02/15/15.  Pt reports 4x 200 mg Ibrophen today to no relief.  Pt states he took 12x 200 mg Ibrophen yesterday with no relief.  Pt reports tooth look black.

## 2015-10-20 ENCOUNTER — Emergency Department (HOSPITAL_COMMUNITY): Payer: Self-pay

## 2015-10-20 ENCOUNTER — Encounter (HOSPITAL_COMMUNITY): Payer: Self-pay | Admitting: Emergency Medicine

## 2015-10-20 ENCOUNTER — Emergency Department (HOSPITAL_COMMUNITY)
Admission: EM | Admit: 2015-10-20 | Discharge: 2015-10-20 | Disposition: A | Discharge status: Home / Self Care | Payer: Self-pay | Attending: Emergency Medicine | Admitting: Emergency Medicine

## 2015-10-20 DIAGNOSIS — N50812 Left testicular pain: Secondary | ICD-10-CM

## 2015-10-20 DIAGNOSIS — Z79899 Other long term (current) drug therapy: Secondary | ICD-10-CM | POA: Insufficient documentation

## 2015-10-20 DIAGNOSIS — M545 Low back pain: Secondary | ICD-10-CM | POA: Insufficient documentation

## 2015-10-20 DIAGNOSIS — N433 Hydrocele, unspecified: Secondary | ICD-10-CM | POA: Insufficient documentation

## 2015-10-20 DIAGNOSIS — M549 Dorsalgia, unspecified: Secondary | ICD-10-CM

## 2015-10-20 DIAGNOSIS — F17210 Nicotine dependence, cigarettes, uncomplicated: Secondary | ICD-10-CM | POA: Insufficient documentation

## 2015-10-20 LAB — CBC WITH DIFFERENTIAL/PLATELET
Basophils Absolute: 0 10*3/uL (ref 0.0–0.1)
Basophils Relative: 0 %
Eosinophils Absolute: 0.4 10*3/uL (ref 0.0–0.7)
Eosinophils Relative: 4 %
HEMATOCRIT: 42.3 % (ref 39.0–52.0)
Hemoglobin: 14.1 g/dL (ref 13.0–17.0)
Lymphocytes Relative: 33 %
Lymphs Abs: 3.1 10*3/uL (ref 0.7–4.0)
MCH: 32 pg (ref 26.0–34.0)
MCHC: 33.3 g/dL (ref 30.0–36.0)
MCV: 95.9 fL (ref 78.0–100.0)
Monocytes Absolute: 0.9 10*3/uL (ref 0.1–1.0)
Monocytes Relative: 9 %
NEUTROS ABS: 5.1 10*3/uL (ref 1.7–7.7)
Neutrophils Relative %: 54 %
Platelets: 312 10*3/uL (ref 150–400)
RBC: 4.41 MIL/uL (ref 4.22–5.81)
RDW: 12.4 % (ref 11.5–15.5)
WBC: 9.4 10*3/uL (ref 4.0–10.5)

## 2015-10-20 LAB — URINALYSIS, ROUTINE W REFLEX MICROSCOPIC
BILIRUBIN URINE: NEGATIVE
Glucose, UA: NEGATIVE mg/dL
HGB URINE DIPSTICK: NEGATIVE
Ketones, ur: NEGATIVE mg/dL
Leukocytes, UA: NEGATIVE
Nitrite: NEGATIVE
PH: 6 (ref 5.0–8.0)
Protein, ur: NEGATIVE mg/dL
SPECIFIC GRAVITY, URINE: 1.005 (ref 1.005–1.030)
UROBILINOGEN UA: 0.2 mg/dL (ref 0.0–1.0)

## 2015-10-20 LAB — BASIC METABOLIC PANEL
ANION GAP: 8 (ref 5–15)
BUN: 11 mg/dL (ref 6–20)
CO2: 26 mmol/L (ref 22–32)
CREATININE: 1.04 mg/dL (ref 0.61–1.24)
Calcium: 9 mg/dL (ref 8.9–10.3)
Chloride: 104 mmol/L (ref 101–111)
GFR calc Af Amer: >60 mL/min (ref >60)
GFR calc non Af Amer: >60 mL/min (ref >60)
Glucose, Bld: 88 mg/dL (ref 65–99)
Potassium: 3.8 mmol/L (ref 3.5–5.1)
Sodium: 138 mmol/L (ref 135–145)

## 2015-10-20 MED ORDER — IBUPROFEN 600 MG PO TABS: 600.0000 mg | ORAL_TABLET | Freq: Three times a day (TID) | ORAL | Status: DC | PRN

## 2015-10-20 MED ORDER — HYDROMORPHONE HCL 1 MG/ML IJ SOLN
1.0000 mg | Freq: Once | INTRAMUSCULAR | Status: AC
  Administered 2015-10-20: 1 mg via INTRAVENOUS
  Filled 2015-10-20: qty 1

## 2015-10-20 MED ORDER — KETOROLAC TROMETHAMINE 30 MG/ML IJ SOLN
30.0000 mg | Freq: Once | INTRAMUSCULAR | Status: AC
  Administered 2015-10-20: 30 mg via INTRAVENOUS
  Filled 2015-10-20: qty 1

## 2015-10-20 MED ORDER — OXYCODONE-ACETAMINOPHEN 5-325 MG PO TABS: 1.0000 | ORAL_TABLET | Freq: Four times a day (QID) | ORAL | Status: DC | PRN

## 2015-10-20 MED ORDER — CYCLOBENZAPRINE HCL 10 MG PO TABS: 10.0000 mg | ORAL_TABLET | Freq: Three times a day (TID) | ORAL | Status: DC | PRN

## 2015-10-20 NOTE — Progress Notes (Signed)
Pt [place] in [place] "I don't usually get sick"   CM discussed and provided written information for uninsured accepting pcps, discussed the importance of pcp vs EDP services for f/u care, www.needymeds.org, www.goodrx.com, discounted pharmacies and other [place] resources such as [facility] , [facility], affordable care act, financial assistance, uninsured dental services, [place] med assist, DSS and  health department  Reviewed resources for [place] uninsured accepting pcps like [facility], family medicine [facility] [address], [facility], [facility], local urgent care centers, [facility], [facility] family [facility], general medical clinics, family services of the [place], [facility] urgent care plus others, medication resources, [facility] out patient [facility]ies, [facility], [facility] and housing Pt voiced understanding and appreciation of resources provided   Pt with male and male visitors at bedside Male visitor took all resources

## 2015-10-20 NOTE — ED Provider Notes (Signed)
CSN: [id number]     Arrival date & time 10/20/15  1200 History   First MD Initiated Contact with Patient 10/20/15 1501     Chief Complaint  Patient presents with  . Back Injury  . Testicle Pain     (Consider location/radiation/quality/duration/timing/severity/associated sxs/prior Treatment) HPI  49 year old male with left-sided back pain for 9 days. He states that he noticed the pain 9 days ago but acutely worsened after the previously been getting better 4 days ago. He was bent over working on his car when all this and he developed an acute  Severe pain. Over last 1-2 days he has noticed pain shooting into his left testicle. He denies testicular swelling or penile pain. No dysuria or hematuria.  Pain waxes and wanes.  Movement sometimes makes the pain worse, but sometimes he cannot find a comfortable position and has to move around a lot  Due to how severe the pain is. Has tried Tylenol PM with no relief.  Denies any midline back trauma or pain. No weakness or numbness. No pain radiating down his legs.  Past Medical History  Diagnosis Date  . Neck pain    Past Surgical History  Procedure Laterality Date  . Appendectomy     No family history on file. Social History  Substance Use Topics  . Smoking status: Light Tobacco Smoker    Types: Cigarettes  . Smokeless tobacco: None  . Alcohol Use: Yes    Review of Systems  Constitutional: Negative for fever.  Gastrointestinal: Negative for vomiting and abdominal pain.  Genitourinary: Positive for testicular pain. Negative for dysuria, hematuria, scrotal swelling and penile pain.  Musculoskeletal: Positive for back pain.  All other systems reviewed and are negative.     Allergies  Review of patient's allergies indicates no known allergies.  Home Medications   Prior to Admission medications   Medication Sig Start Date End Date Taking? Authorizing Provider  B Complex Vitamins (VITAMIN B COMPLEX PO) Take 1 tablet by mouth daily.    Yes Historical Provider, MD  Bioflavonoid Products (BIOFLEX) TABS Take 1 tablet by mouth daily.   Yes Historical Provider, MD  Cyanocobalamin (VITAMIN B 12 PO) Take 2 tablets by mouth daily.   Yes Historical Provider, MD  diphenhydramine-acetaminophen (TYLENOL PM) 25-500 MG TABS tablet Take 2 tablets by mouth at bedtime as needed (pain).   Yes Historical Provider, MD  fexofenadine (ALLEGRA) 60 MG tablet Take 60 mg by mouth daily as needed for allergies or rhinitis.   Yes Historical Provider, MD  ibuprofen (ADVIL,MOTRIN) 200 MG tablet Take 400-800 mg by mouth every 6 (six) hours as needed for moderate pain.    Yes Historical Provider, MD  OVER THE COUNTER MEDICATION 1 tablet. Multi-Vitamin zinc, magnesium, calcium   Yes Historical Provider, MD  diazepam (VALIUM) 5 MG tablet Take 1 tablet (5 mg total) by mouth every 8 (eight) hours as needed for muscle spasms. Patient not taking: Reported on 02/20/2015 11/23/13   Layla MawKristen N Ward, DO  doxycycline (VIBRAMYCIN) 100 MG capsule Take 1 capsule (100 mg total) by mouth 2 (two) times daily. One po bid x 7 days 02/20/15   Mercedes Camprubi-Soms, PA-C  HYDROcodone-acetaminophen (NORCO) 5-325 MG per tablet Take 1 tablet by mouth every 6 (six) hours as needed for severe pain. 02/20/15   Mercedes Camprubi-Soms, PA-C  naproxen (NAPROSYN) 500 MG tablet Take 1 tablet (500 mg total) by mouth 2 (two) times daily as needed for mild pain, moderate pain or headache (TAKE WITH MEALS.).  02/20/15   Mercedes Camprubi-Soms, PA-C  oxyCODONE-acetaminophen (ROXICET) 5-325 MG per tablet Take 1-2 tablets by mouth every 4 (four) hours as needed for severe pain. Patient not taking: Reported on 02/20/2015 11/23/13   Kristen N Ward, DO  promethazine (PHENERGAN) 25 MG tablet Take 1 tablet (25 mg total) by mouth every 6 (six) hours as needed for nausea or vomiting. Patient not taking: Reported on 02/20/2015 11/23/13   Kristen N Ward, DO   BP 141/91 mmHg  Pulse 56  Temp(Src) 97.5 F (36.4 C)  (Temporal)  Resp 20  Ht  (1.778 m)  Wt 192 lb (87.091 kg)  BMI 27.55 kg/m2  SpO2 100% Physical Exam  Constitutional: He is oriented to person, place, and time. He appears well-developed and well-nourished.  HENT:  Head: Normocephalic and atraumatic.  Right Ear: External ear normal.  Left Ear: External ear normal.  Nose: Nose normal.  Eyes: Right eye exhibits no discharge. Left eye exhibits no discharge.  Neck: Neck supple.  Cardiovascular: Normal rate, regular rhythm, normal heart sounds and intact distal pulses.   Pulmonary/Chest: Effort normal and breath sounds normal.  Abdominal: Soft. He exhibits no distension. There is no tenderness.  Genitourinary: Testes normal and penis normal. Cremasteric reflex is present. Right testis shows no swelling and no tenderness. Left testis shows no swelling and no tenderness. Circumcised.  Musculoskeletal: He exhibits no edema.       Lumbar back: He exhibits tenderness. He exhibits no bony tenderness.       Back:  Neurological: He is alert and oriented to person, place, and time. He has normal reflexes.  5/5 strength in bilateral lower extremities in all major muscle groups. Normal sensation  Skin: Skin is warm and dry.  Nursing note and vitals reviewed.   ED Course  Procedures (including critical care time) Labs Review Labs Reviewed  BASIC METABOLIC PANEL  CBC WITH DIFFERENTIAL/PLATELET  URINALYSIS, ROUTINE W REFLEX MICROSCOPIC (NOT AT [facility])    Imaging Review US Scrotum  10/20/2015  CLINICAL DATA:  Left testicular pain, 4 days duration. EXAM: ULTRASOUND OF SCROTUM TECHNIQUE: Complete ultrasound examination of the testicles, epididymis, and other scrotal structures was performed. COMPARISON:  None. FINDINGS: Right testicle Measurements: 4.5 x 2.6 x 2.8 cm. Normal morphology. Normal Doppler evaluation. Left testicle Measurements: 4.4 x 2.7 x 3.0 cm. Normal morphology. Normal Doppler evaluation. Right epididymis:  Normal in size and  appearance. Left epididymis:  Normal in size and appearance. Hydrocele:  Small left hydrocele, not likely significant. Varicocele:  Small right varicocele, not likely significant. IMPRESSION: Normal appearance of the testicles themselves. No evidence of orchitis or epididymitis by imaging. Small left hydrocele of doubtful significance. Electronically Signed   By: Paulina Fusi M.D.   On: 10/20/2015 18:15   [place] Art/ven Flow Abd Pelv Doppler  10/20/2015  CLINICAL DATA:  Left testicular pain, 4 days duration. EXAM: ULTRASOUND OF SCROTUM TECHNIQUE: Complete ultrasound examination of the testicles, epididymis, and other scrotal structures was performed. COMPARISON:  None. FINDINGS: Right testicle Measurements: 4.5 x 2.6 x 2.8 cm. Normal morphology. Normal Doppler evaluation. Left testicle Measurements: 4.4 x 2.7 x 3.0 cm. Normal morphology. Normal Doppler evaluation. Right epididymis:  Normal in size and appearance. Left epididymis:  Normal in size and appearance. Hydrocele:  Small left hydrocele, not likely significant. Varicocele:  Small right varicocele, not likely significant. IMPRESSION: Normal appearance of the testicles themselves. No evidence of orchitis or epididymitis by imaging. Small left hydrocele of doubtful significance. Electronically Signed   By:  Paulina Fusi M.D.   On: 10/20/2015 18:15   Ct Renal Stone Study  10/20/2015  CLINICAL DATA:  Back pain after operating Ree Kida hammer. EXAM: CT ABDOMEN AND PELVIS WITHOUT CONTRAST TECHNIQUE: Multidetector CT imaging of the abdomen and pelvis was performed following the standard protocol without IV contrast. COMPARISON:  None. FINDINGS: Lower chest: There is no pleural effusion identified. No pericardial effusion. Hepatobiliary: There is no focal liver abnormality identified. The gallbladder appears normal. No biliary dilatation. Pancreas: Unremarkable appearance of the pancreas. Spleen: The spleen is normal. Adrenals/Urinary Tract: The adrenal glands are  normal. Normal appearance of the right kidney. The left kidney is also normal. The urinary bladder is within normal limits. Stomach/Bowel: The stomach and the small bowel loops have a normal course and caliber. No bowel obstruction. Normal appearance of the colon. Vascular/Lymphatic: Normal appearance of the abdominal aorta. No enlarged retroperitoneal or mesenteric adenopathy. No enlarged pelvic or inguinal lymph nodes. Reproductive: The prostate gland and seminal vesicles appear normal. Symmetric appearance of the seminal vesicles. Other: There is no ascites or focal fluid collections within the abdomen or pelvis. Musculoskeletal: There is mild degenerative disc disease within the lower thoracic spine. IMPRESSION: 1. No acute findings within the abdomen or pelvis. 2. No nephrolithiasis or obstructive uropathy identified. Electronically Signed   By: Signa Kell M.D.   On: 10/20/2015 16:54   I have personally reviewed and evaluated these images and lab results as part of my medical decision-making.   EKG Interpretation None      MDM   Final diagnoses:  Left testicular pain  Left-sided back pain, unspecified location  Left hydrocele    Patient's back pain is most likely musculoskeletal. Unclear why he has had some radiation into his groin given no kidney stone on a CT scan. His testicle is nontender and ultrasound is unremarkable except for a small hydrocele. No infectious symptoms. Plan to treat with NSAIDs, short course of narcotics, and muscle relaxers. No midline pain or concerning findings would be concerning for a spinal emergency. Discussed strict return precautions and recommend follow-up with a PCP as outpatient.    Pricilla Loveless, MD 10/21/15 [phone]

## 2015-10-20 NOTE — ED Notes (Signed)
Pt transported to CT ?

## 2015-10-20 NOTE — ED Notes (Signed)
Patient states that a week ago that he was operating a Landscape architectjack hammer and it made his back sore. On Thursday, he bent over to work on his car and was put into a tremendous amount of pain. States he has had lower left back and left testicle pain since Thursday.

## 2015-10-20 NOTE — Discharge Instructions (Signed)
°Emergency Department Resource Guide °1) Find a Doctor and Pay Out of Pocket °Although you won't have to find out who is covered by your insurance plan, it is a good idea to ask around and get recommendations. You will then need to call the office and see if the doctor you have chosen will accept you as a new patient and what types of options they offer for patients who are self-pay. Some doctors offer discounts or will set up payment plans for their patients who do not have insurance, but you will need to ask so you aren't surprised when you get to your appointment. ° °2) Contact Your Local Health Department °Not all health departments have doctors that can see patients for sick visits, but many do, so it is worth a call to see if yours does. If you don't know where your local health department is, you can check in your phone book. The CDC also has a tool to help you locate your state's health department, and many state websites also have listings of all of their local health departments. ° °3) Find a Walk-in Clinic °If your illness is not likely to be very severe or complicated, you may want to try a walk in clinic. These are popping up all over the country in pharmacies, drugstores, and shopping centers. They're usually staffed by nurse practitioners or physician assistants that have been trained to treat common illnesses and complaints. They're usually fairly quick and inexpensive. However, if you have serious medical issues or chronic medical problems, these are probably not your best option. ° °No Primary Care Doctor: °- Call [facility] at  832-8000 - they can help you locate a primary care doctor that  accepts your insurance, provides certain services, etc. °- Physician Referral Service- 1-800-533-3463 ° °Chronic Pain Problems: °Organization         Address  Phone   Notes  °[facility]  (336) 297-2271 Patients need to be referred by their primary care doctor.  ° °Medication  Assistance: °Organization         Address  Phone   Notes  °[place] [facility] [address] °[place], [place] [address] (336) 641-8030 --Must be a resident of [place] °-- Must have NO insurance coverage whatsoever (no Medicaid/ Medicare, etc.) °-- The pt. MUST have a primary care doctor that directs their care regularly and follows them in the community °  °[facility]  (866) 331-1348   °[employer]  (888) 892-1162   ° °Agencies that provide inexpensive medical care: °Organization         Address  Phone   Notes  °[facility]  (336) 832-8035   °[facility]    (336) 832-7272   °[facility] [address] °[place],  [address] (336) 832-4777   °[facility] [address], °[place] (336) 271-4999   °[facility]    (336) 373-0678   °[facility]    (336) 272-1050   °[facility] ° [address], [place] Phone:  (336) 832-4444, Fax:  (336) 832-4440 Hours of Operation:  9 am - 6 pm, M-F.  Also accepts Medicaid/Medicare and self-pay.  °[facility] ° [address], [place] Phone: (336) 832-3150, Fax: (336) [phone]. Hours of Operation:  8:30 am - 5:30 pm, M-F.  Also accepts Medicaid and self-pay.  °[facility] [place] [address], [place] Phone: (336) 878-6027   °[facility] [address], [place], [place] (336)723-1848, Ext. 123 Mondays & Thursdays: 7-9 AM.  First 15 patients are seen on a first come, first serve basis. °  ° °Medicaid-accepting [place] Providers: ° °Organization         Address  Phone   Notes  °[facility] [address], [place] (336) 641-2100 Also accepts self-pay patients.  °[facility] [address], [place] ° (336) 856-9996   °[facility] [address], [place]  (336) 288-8857   °[facility] [address] [place] [address], [place] (336) 299-7000   °Veita Bland [address], [place]  ° (336) 373-1557 Only accepts [place] Access Medicaid patients after they have their name applied to their card.  ° °Self-Pay (no insurance) in [place]: ° °Organization         Address  Phone   Notes  °Sickle Cell Patients, [facility] [address], [place] (336) 832-1970   °[facility] [address], [place] (336) 832-4400   °[facility] ° [address], [place] (336) 992-4800   °[facility]/Dr. Osei-Bonsu ° [address] [place] [address], [place] or [address], [place] (336) [phone] Phone number for both [place] and [place] locations is the same.  °[facility] [address], [place] (336) 299-0000   °[facility] [address] [place] [address], [place] or [address] (336) 852-7530 °(336) 878-2260   °[facility] [address],  (336) 350-1642, phone; (336) 294-5005, fax Sees patients 1st and 3rd Saturday of every month.  Must not qualify for public or private insurance (i.e. Medicaid, Medicare, [place] Health Choice, Veterans' Benefits) • Household income should be no more than 200% of the poverty level •The clinic cannot treat you if you are pregnant or think you are pregnant • Sexually transmitted diseases are not treated at the clinic.  ° ° °Dental Care: °Organization         Address  Phone  Notes  °[place] [facility] [address],  (336) 641-6152 Accepts children up to age 21 who are enrolled in Medicaid or [place] Health Choice; pregnant women with a Medicaid card; and children who have applied for Medicaid or [place] Health Choice, but were declined, whose parents can pay a reduced fee at time of service.  °[place]  [facility] [place]  [address], [place] (336) 641-7733 Accepts children up to age 21 who are enrolled in Medicaid or [place] Health Choice; pregnant women with a Medicaid card; and children who have applied for Medicaid or [place] Health Choice, but were declined, whose parents can pay a reduced fee at time of service.  °[facility] ° [address],  (336) 641-4533 Patients are seen by appointment only. Walk-ins are not accepted. [facility] will see patients 18 years of age and older. °Monday - Tuesday (8am-5pm) °Most Wednesdays (8:30-5pm) °$30 per visit, cash only  °[facility] ° [address], [place] (336) 641-4533 Patients are seen by appointment only. Walk-ins are not accepted. [facility] will see patients 18 years of age and older. °One   Wednesday Evening (Monthly: Volunteer Based).  $30 per visit, cash only  °[facility]  (919) 537-3737 for adults; Children under age 4, call Graduate Pediatric Dentistry at (919) [phone]. Children aged 4-14, please call (919) [phone] to request a pediatric application. ° Dental services are provided in all areas of dental care including fillings, crowns and bridges, complete and partial dentures, implants, gum treatment, root canals, and extractions. Preventive care is also provided. Treatment is provided to both adults and children. °Patients are selected via a lottery and there is often a waiting list. °  °[facility] [address], °[place] ° (336) 763-8833 [email or web address] °  °[facility] [address], [place], [place] (336)723-1848, Ext. 123 Second and Fourth Thursday of each month, opens at 6:30 AM; Clinic ends at 9 AM.  Patients are seen on a first-come first-served basis, and a limited number are seen during each clinic.  ° °[facility] ° [address], [place], [place] (336) 723-7904    Eligibility Requirements °You must have lived in [place], [place], or [place] counties for at least the last three months. °  You cannot be eligible for state or federal sponsored healthcare insurance, including Veterans Administration, Medicaid, or Medicare. °  You generally cannot be eligible for healthcare insurance through your employer.  °  How to apply: °Eligibility screenings are held every Tuesday and Wednesday afternoon from 1:00 pm until 4:00 pm. You do not need an appointment for the interview!  °[facility] [address], [place], [place] 336-631-2330   °[facility]  336-342-8273   °[facility]  336-703-3100   °[facility]  336-570-6415   ° °Behavioral Health Resources in the Community: °Intensive Outpatient Programs °Organization         Address  Phone  Notes  °[facility] [address], [place], [place] 336-878-6098   °[facility] [address], [place], [place] 336-832-9800   °[facility] [address], [place], [place] ° 336-882-2125   °[facility] [address],  °[place], [place] 1-800-853-5163 or 336-641-4981   °Substance Abuse Resources °Organization         Address  Phone  Notes  °[facility]  336-882-2125   °[facility]  336-784-9470   °[facility]  336-285-9073   °[facility]  336-845-3988   °Residential & Outpatient Substance Abuse Program  1-800-659-3381   °Psychological Services °Organization         Address  Phone  Notes  °[facility]  336- 832-9600   °[facility]  336- 378-7881   °[facility] [address], [place] 1-800-853-5163 or 336-641-4981   ° °Mobile Crisis Teams °Organization         Address  Phone  Notes  °[facility], Mobile Crisis Care Unit  1-877-626-1772   °Assertive °[facility] ° [address]  [place], [place] 336-834-9664   °Sharon DeEsch [address] °[place] 336-554-5454   ° °Self-Help/Support Groups °Organization         Address  Phone             Notes  °[facility] - variety of support groups  336- 373-1402 Call for more information  °Narcotics Anonymous (NA), [facility] [address], °[place]   2 meetings at this location  ° °  Residential Treatment Programs °Organization         Address  Phone  Notes  °[facility] [address],    °[place]  1-866-801-8205   °[facility] ° [address], [place], [place] 704-293-8524   °[facility] [address], [place] 336-845-3988 Admissions: 8am-3pm M-F  °[facility] [address]    °[place], [place] 336-841-1104   °[facility] [address], [place], [place] 336-379-7146   °[facility] [address]  °[place], [place] 336-285-9073   °[facility] - Intensive Outpatient [address], [place], [place] 336-852-3033   °[facility] [address]  °[place], [place] 1-877-615-2722 or 336-784-9470   °[facility] [address] [place], [place] 336-227-7417 Accepts Medicaid  °[facility] [address]  °[place] 1-800-659-3381 Substance Abuse/Addiction Treatment  ° °[place] Behavioral Health Resources °Organization         Address  Phone  Notes  °[facility]  (888) 581-9988   °Julie Brannon, PhD [address] [place], [place]   (336) 349-5553 or (336) 951-0000   °[facility]   [address] °[place], [place] (336) 349-4454   °[facility] [address], [place], [place] (336) 342-8316 Insurance/Medicaid/sponsorship through [facility]  °[facility] [address]                                    [place], [place] (336) 342-8316 Therapy/tele-psych/case    °[facility] [address]  ° [place], [place] (336) 349-2233    °Dr. Arfeen  (336) 349-4544   °[facility] [place]  [employer] [place] [facility] 1) [address],  °2) [address], [place] °3)  [address], [place] (336) 349-3220 °(336) 342-7768 ° °(336) 342-8140   °[place] [facility] (336) 342-1394 or (336) 342-3537 (After Hours)    ° ° °

## 2015-10-20 NOTE — ED Notes (Signed)
MD at bedside. 

## 2015-12-02 ENCOUNTER — Emergency Department (HOSPITAL_BASED_OUTPATIENT_CLINIC_OR_DEPARTMENT_OTHER): Payer: Self-pay

## 2015-12-02 ENCOUNTER — Encounter (HOSPITAL_BASED_OUTPATIENT_CLINIC_OR_DEPARTMENT_OTHER): Payer: Self-pay | Admitting: *Deleted

## 2015-12-02 ENCOUNTER — Inpatient Hospital Stay (HOSPITAL_BASED_OUTPATIENT_CLINIC_OR_DEPARTMENT_OTHER)
Admission: EM | Admit: 2015-12-02 | Discharge: 2015-12-05 | DRG: 572 | Disposition: A | Discharge status: Home / Self Care | Payer: Self-pay | Attending: Orthopedic Surgery | Admitting: Orthopedic Surgery

## 2015-12-02 DIAGNOSIS — L03115 Cellulitis of right lower limb: Principal | ICD-10-CM | POA: Diagnosis present

## 2015-12-02 DIAGNOSIS — L0390 Cellulitis, unspecified: Secondary | ICD-10-CM | POA: Diagnosis present

## 2015-12-02 DIAGNOSIS — S70321A Blister (nonthermal), right thigh, initial encounter: Secondary | ICD-10-CM

## 2015-12-02 DIAGNOSIS — S70329A Blister (nonthermal), unspecified thigh, initial encounter: Secondary | ICD-10-CM

## 2015-12-02 DIAGNOSIS — B9562 Methicillin resistant Staphylococcus aureus infection as the cause of diseases classified elsewhere: Secondary | ICD-10-CM | POA: Diagnosis present

## 2015-12-02 DIAGNOSIS — F419 Anxiety disorder, unspecified: Secondary | ICD-10-CM | POA: Diagnosis present

## 2015-12-02 DIAGNOSIS — F17210 Nicotine dependence, cigarettes, uncomplicated: Secondary | ICD-10-CM | POA: Diagnosis present

## 2015-12-02 DIAGNOSIS — L089 Local infection of the skin and subcutaneous tissue, unspecified: Secondary | ICD-10-CM | POA: Diagnosis present

## 2015-12-02 DIAGNOSIS — L02415 Cutaneous abscess of right lower limb: Secondary | ICD-10-CM | POA: Diagnosis present

## 2015-12-02 HISTORY — DX: Local infection of the skin and subcutaneous tissue, unspecified: L08.9

## 2015-12-02 HISTORY — DX: Blister (nonthermal), right thigh, initial encounter: S70.321A

## 2015-12-02 LAB — COMPREHENSIVE METABOLIC PANEL
ALT: 19 U/L (ref 17–63)
ANION GAP: 6 (ref 5–15)
AST: 19 U/L (ref 15–41)
Albumin: 3.6 g/dL (ref 3.5–5.0)
Alkaline Phosphatase: 65 U/L (ref 38–126)
BUN: 11 mg/dL (ref 6–20)
CO2: 28 mmol/L (ref 22–32)
Calcium: 8.8 mg/dL — ABNORMAL LOW (ref 8.9–10.3)
Chloride: 104 mmol/L (ref 101–111)
Creatinine, Ser: 1 mg/dL (ref 0.61–1.24)
GFR calc Af Amer: >60 mL/min (ref >60)
GFR calc non Af Amer: >60 mL/min (ref >60)
GLUCOSE: 117 mg/dL — AB (ref 65–99)
POTASSIUM: 3.5 mmol/L (ref 3.5–5.1)
Sodium: 138 mmol/L (ref 135–145)
Total Bilirubin: 0.5 mg/dL (ref 0.3–1.2)
Total Protein: 7 g/dL (ref 6.5–8.1)

## 2015-12-02 LAB — CBC WITH DIFFERENTIAL/PLATELET
Basophils Absolute: 0 10*3/uL (ref 0.0–0.1)
Basophils Relative: 0 %
Eosinophils Absolute: 0.1 10*3/uL (ref 0.0–0.7)
Eosinophils Relative: 1 %
HCT: 41.6 % (ref 39.0–52.0)
Hemoglobin: 13.9 g/dL (ref 13.0–17.0)
LYMPHS ABS: 2.4 10*3/uL (ref 0.7–4.0)
LYMPHS PCT: 22 %
MCH: 31.6 pg (ref 26.0–34.0)
MCHC: 33.4 g/dL (ref 30.0–36.0)
MCV: 94.5 fL (ref 78.0–100.0)
MONO ABS: 1.5 10*3/uL — AB (ref 0.1–1.0)
MONOS PCT: 14 %
NEUTROS ABS: 6.6 10*3/uL (ref 1.7–7.7)
Neutrophils Relative %: 63 %
Platelets: 285 10*3/uL (ref 150–400)
RBC: 4.4 MIL/uL (ref 4.22–5.81)
RDW: 11.9 % (ref 11.5–15.5)
WBC: 10.6 10*3/uL — ABNORMAL HIGH (ref 4.0–10.5)

## 2015-12-02 LAB — I-STAT CG4 LACTIC ACID, ED: Lactic Acid, Venous: 1.01 mmol/L (ref 0.5–2.0)

## 2015-12-02 LAB — C-REACTIVE PROTEIN: CRP: 6.9 mg/dL — AB (ref <1.0)

## 2015-12-02 MED ORDER — HYDROMORPHONE HCL 1 MG/ML IJ SOLN
1.0000 mg | Freq: Once | INTRAMUSCULAR | Status: AC
  Administered 2015-12-02: 1 mg via INTRAVENOUS
  Filled 2015-12-02: qty 1

## 2015-12-02 MED ORDER — SENNA 8.6 MG PO TABS
1.0000 | ORAL_TABLET | Freq: Two times a day (BID) | ORAL | Status: DC
  Administered 2015-12-03 – 2015-12-05 (×4): 8.6 mg via ORAL
  Filled 2015-12-02 (×5): qty 1

## 2015-12-02 MED ORDER — ALUM & MAG HYDROXIDE-SIMETH 200-200-20 MG/5ML PO SUSP: 30.0000 mL | Freq: Four times a day (QID) | ORAL | Status: DC | PRN

## 2015-12-02 MED ORDER — ACETAMINOPHEN 650 MG RE SUPP
650.0000 mg | Freq: Four times a day (QID) | RECTAL | Status: DC | PRN
Start: 2015-12-02 — End: 2015-12-05

## 2015-12-02 MED ORDER — TRAZODONE HCL 50 MG PO TABS
25.0000 mg | ORAL_TABLET | Freq: Every evening | ORAL | Status: DC | PRN
  Filled 2015-12-02 (×2): qty 1

## 2015-12-02 MED ORDER — SODIUM CHLORIDE 0.9 % IV SOLN: INTRAVENOUS | Status: DC

## 2015-12-02 MED ORDER — DOCUSATE SODIUM 100 MG PO CAPS
100.0000 mg | ORAL_CAPSULE | Freq: Two times a day (BID) | ORAL | Status: DC
  Administered 2015-12-03 – 2015-12-05 (×4): 100 mg via ORAL
  Filled 2015-12-02 (×5): qty 1

## 2015-12-02 MED ORDER — MAGNESIUM CITRATE PO SOLN: 1.0000 | Freq: Once | ORAL | Status: DC | PRN

## 2015-12-02 MED ORDER — DEXTROSE 5 % IV SOLN: 2.0000 g | Freq: Once | INTRAVENOUS | Status: DC

## 2015-12-02 MED ORDER — CEFTRIAXONE SODIUM 2 G IJ SOLR
INTRAMUSCULAR | Status: AC
  Administered 2015-12-02: 2000 mg
  Filled 2015-12-02: qty 2

## 2015-12-02 MED ORDER — VANCOMYCIN HCL 10 G IV SOLR
1500.0000 mg | Freq: Once | INTRAVENOUS | Status: AC
  Administered 2015-12-02: 1500 mg via INTRAVENOUS
  Filled 2015-12-02: qty 1500

## 2015-12-02 MED ORDER — POLYETHYLENE GLYCOL 3350 17 G PO PACK: 17.0000 g | PACK | Freq: Every day | ORAL | Status: DC | PRN

## 2015-12-02 MED ORDER — LIDOCAINE HCL (PF) 1 % IJ SOLN
5.0000 mL | Freq: Once | INTRAMUSCULAR | Status: AC
  Administered 2015-12-02: 5 mL
  Filled 2015-12-02: qty 5

## 2015-12-02 MED ORDER — ONDANSETRON HCL 4 MG PO TABS: 4.0000 mg | ORAL_TABLET | Freq: Four times a day (QID) | ORAL | Status: DC | PRN

## 2015-12-02 MED ORDER — BISACODYL 10 MG RE SUPP: 10.0000 mg | Freq: Every day | RECTAL | Status: DC | PRN

## 2015-12-02 MED ORDER — POTASSIUM CHLORIDE IN NACL 20-0.45 MEQ/L-% IV SOLN
INTRAVENOUS | Status: DC
  Administered 2015-12-02 – 2015-12-03 (×2): 75 mL/h via INTRAVENOUS
  Administered 2015-12-04: 22:00:00 via INTRAVENOUS
  Filled 2015-12-02 (×6): qty 1000

## 2015-12-02 MED ORDER — HYDROMORPHONE HCL 1 MG/ML IJ SOLN
0.5000 mg | INTRAMUSCULAR | Status: DC | PRN
  Administered 2015-12-02 – 2015-12-04 (×10): 0.5 mg via INTRAVENOUS
  Filled 2015-12-02 (×10): qty 1

## 2015-12-02 MED ORDER — DEXTROSE 5 % IV SOLN
2.0000 g | INTRAVENOUS | Status: DC
  Filled 2015-12-02: qty 2

## 2015-12-02 MED ORDER — VANCOMYCIN HCL IN DEXTROSE 1-5 GM/200ML-% IV SOLN
1000.0000 mg | Freq: Three times a day (TID) | INTRAVENOUS | Status: DC
  Administered 2015-12-03 – 2015-12-05 (×7): 1000 mg via INTRAVENOUS
  Filled 2015-12-02 (×10): qty 200

## 2015-12-02 MED ORDER — VANCOMYCIN HCL IN DEXTROSE 1-5 GM/200ML-% IV SOLN: 1000.0000 mg | Freq: Once | INTRAVENOUS | Status: DC

## 2015-12-02 MED ORDER — ACETAMINOPHEN 325 MG PO TABS
650.0000 mg | ORAL_TABLET | Freq: Four times a day (QID) | ORAL | Status: DC | PRN
Start: 2015-12-02 — End: 2015-12-05
  Administered 2015-12-03 – 2015-12-04 (×2): 650 mg via ORAL
  Filled 2015-12-02 (×2): qty 2

## 2015-12-02 MED ORDER — ONDANSETRON HCL 4 MG/2ML IJ SOLN
4.0000 mg | Freq: Four times a day (QID) | INTRAMUSCULAR | Status: DC | PRN
  Filled 2015-12-02: qty 2

## 2015-12-02 MED ORDER — OXYCODONE HCL 5 MG PO TABS
5.0000 mg | ORAL_TABLET | ORAL | Status: DC | PRN
  Administered 2015-12-02 – 2015-12-05 (×14): 5 mg via ORAL
  Filled 2015-12-02 (×14): qty 1

## 2015-12-02 MED ORDER — SODIUM CHLORIDE 0.9 % IV BOLUS (SEPSIS)
1000.0000 mL | Freq: Once | INTRAVENOUS | Status: AC
  Administered 2015-12-02: 1000 mL via INTRAVENOUS

## 2015-12-02 NOTE — ED Provider Notes (Signed)
CSN: [id number]     Arrival date & time 12/02/15  1326 History   First MD Initiated Contact with Patient 12/02/15 1501     Chief Complaint  Patient presents with  . Leg Swelling     (Consider location/radiation/quality/duration/timing/severity/associated sxs/prior Treatment) HPI Patient developed pain and swelling on his right knee 3 days ago. He suspected he had a spider bite. He works with another employee who also developed a skin infection that was presumed to be a spider bite. They thought he came in some shipment boxes that they had unloaded. Patient does work outside. No other specific injury. The patient never visualized a bite. He reports he began by putting warm compresses on it to bring the fluid to the surface. He reports yesterday pus did come out of the most swollen area. He reports some pus came out today as well but is now scabbed over. He reports that this morning when he got up the swelling had increased a lot and now he has redness from his thigh to his ankle. He reports the knee is much more painful and it hurts a lot to bend it at all. He reports he can bend it (illustrating approximately 130 degrees). He reports today he has also developed chills and generalized aching and weakness. he denies any medical history. He reports he has been otherwise well. Past Medical History  Diagnosis Date  . Neck pain    Past Surgical History  Procedure Laterality Date  . Appendectomy     History reviewed. No pertinent family history. Social History  Substance Use Topics  . Smoking status: Light Tobacco Smoker    Types: Cigarettes  . Smokeless tobacco: None  . Alcohol Use: Yes    Review of Systems  10 Systems reviewed and are negative for acute change except as noted in the HPI.   Allergies  Review of patient's allergies indicates no known allergies.  Home Medications   Prior to Admission medications   Not on File   BP 125/91 mmHg  Pulse 59  Temp(Src) 98.2 F (36.8  C)  Resp 18  Ht  (1.702 m)  Wt 185 lb (83.915 kg)  BMI 28.97 kg/m2  SpO2 96% Physical Exam  Constitutional: He is oriented to person, place, and time. He appears well-developed and well-nourished.  Patient is alert and nontoxic. He does appear to be in pain.  HENT:  Head: Normocephalic and atraumatic.  Eyes: EOM are normal.  Neck: Neck supple.  Cardiovascular: Normal rate, regular rhythm, normal heart sounds and intact distal pulses.   Pulmonary/Chest: Effort normal and breath sounds normal.  Abdominal: Soft. Bowel sounds are normal. He exhibits no distension. There is no tenderness.  Musculoskeletal: He exhibits edema and tenderness.  See photo documentation. Patient has limited range of motion at the right knee with flexion to approximately 130. Foot is in good condition with normal neurovascular examination. Patient has a firm area with overlying eschar in the medial knee with fluctuance consistent with diffuse abscess. There also appears to be some effusion of the knee. Erythema extends from the groin to the ankle.  Neurological: He is alert and oriented to person, place, and time. He has normal strength. Coordination normal. GCS eye subscore is 4. GCS verbal subscore is 5. GCS motor subscore is 6.  Skin: Skin is warm, dry and intact.  Psychiatric: He has a normal mood and affect.          ED Course  Procedures (including critical care time)  Apiration of blood/fluid Performed by: Arby BarrettePfeiffer, Ryaan Vanwagoner Consent obtained. Required items: required blood products, implants, devices, and special equipment available Patient identity confirmed: verbally with patient Time out: Immediately prior to procedure a "time out" was called to verify the correct patient, procedure, equipment, support staff and site/side marked as required. Preparation: Patient was prepped and draped in the usual sterile fashion. Patient tolerance: Patient tolerated the procedure well with no immediate  complications.  Location of aspiration: right lateral knee. Area was anesthetized with 1% lidocaine. With 18-gauge insertion, no synovial fluid was returned. Attempted 2 without return of fluid.   Labs Review Labs Reviewed  COMPREHENSIVE METABOLIC PANEL - Abnormal; Notable for the following:    Glucose, Bld 117 (*)    Calcium 8.8 (*)    All other components within normal limits  CBC WITH DIFFERENTIAL/PLATELET - Abnormal; Notable for the following:    WBC 10.6 (*)    Monocytes Absolute 1.5 (*)    All other components within normal limits  CULTURE, BLOOD (ROUTINE X 2)  CULTURE, BLOOD (ROUTINE X 2)  BODY FLUID CULTURE  GRAM STAIN  GLUCOSE, SYNOVIAL FLUID  PROTEIN, SYNOVIAL FLUID  SYNOVIAL CELL COUNT + DIFF, W/ CRYSTALS  I-STAT CG4 LACTIC ACID, ED    Imaging Review Dg Knee Complete 4 Views Right  12/02/2015  CLINICAL DATA:  Swelling and redness to RIGHT knee question insect bite, generalized fatigue, weakness EXAM: RIGHT KNEE - COMPLETE 4+ VIEW COMPARISON:  None FINDINGS: Scattered soft tissue swelling. Osseous mineralization grossly normal for technique. Joint spaces preserved. No acute fracture, dislocation or bone destruction. Metallic foreign body 3 mm length projects anterolaterally at the level of the knee joint. No knee joint effusion. IMPRESSION: Soft tissue swelling without acute bony abnormalities. Metallic foreign body in the soft tissues of the anterior lateral knee. Electronically Signed   By: Ulyses SouthwardMark  Boles M.D.   On: 12/02/2015 16:46   I have personally reviewed and evaluated these images and lab results as part of my medical decision-making.   EKG Interpretation None     Consult: Patient's case was reviewed with Dr. Dion SaucierLandau who will admit for further management. He does request attempted for joint aspirate for cell count and culture. He reports he will evaluate the abscess area for possible more extensive, surgical incision and drainage. MDM   Final diagnoses:    Cellulitis of right lower extremity  Abscess of knee, right   Patient presents as outlined above. He has an abscess develop to the medial upper knee. He now has extensive cellulitis. There is also pain with range of motion in the joint and concern for possible septic joint. Temp was made to aspirate for synovial fluid however I was unsuccessful at obtaining any synovial fluid. This was started on vancomycin and Rocephin in the emergency department. At this time patient does not show signs of sepsis. His mental status is clear, he has no respiratory distress.     Arby BarretteMarcy Jakki Doughty, MD 12/02/15 1739

## 2015-12-02 NOTE — ED Notes (Signed)
Pt c/o ? Insect bite to right knee, swelling redness noted , pt c/o generalized fatigue and weeakness

## 2015-12-02 NOTE — Progress Notes (Addendum)
ANTIBIOTIC CONSULT NOTE - INITIAL  Pharmacy Consult for Vancomycin Indication:  R knee cellulitis with associated abscess  No Known Allergies  Patient Measurements: Height: 5\' 7"  (170.2 cm) Weight: 185 lb (83.915 kg) IBW/kg (Calculated) : 66.1  Vital Signs: Temp: 98.2 F (36.8 C) (12/27 1335) BP: 130/86 mmHg (12/27 1745) Pulse Rate: 68 (12/27 1745)  Labs:  Recent Labs  12/02/15 1520  WBC 10.6*  HGB 13.9  PLT 285  CREATININE 1.00   Microbiology: 12/27 BCx: px  Antiinfectives:  12/27 Ceftriaxone >> 12/27 Vancomycin >>   Assessment: 49 yo male who presented to ED on 12/27 with R knee pain and swelling x 3 days. Admitted for IV abx and further evaluation of cellulitis/abscess.  Goal of Therapy:  Vancomycin trough level 15-20 mcg/ml  Plan:  1. Vancomycin 1500 mg x 1 followed by 1000 mg IV every 8 hours starting on 12/28 2. [facility] 3. SCr Q 72H while on vancomycin 4. Await pending micro data and surgical evaluation and narrow abx as feasible    Pollyann SamplesAndy Mariamawit Depaoli, PharmD, BCPS 12/02/2015, 8:01 PM Pager: [phone]

## 2015-12-02 NOTE — ED Notes (Signed)
Patient transported to X-ray 

## 2015-12-02 NOTE — H&P (Signed)
PREOPERATIVE H&P  Chief Complaint: Right leg infection  HPI: Matthew Bryant is a 49 y.o. male who complains of increasing right leg swelling, redness, pain, unable to ambulate. 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 He has been trying to treat the increasing redness and swelling in his right leg with antibiotics topically and peroxide, without relief. Things are gotten significantly worse over the course of time. He now has difficulty bending his leg, putting weight on it, had received multiple pain medications at [facility], and was transferred here for definitive management.    Past Medical History  Diagnosis Date  . Neck pain   . Infected blister of right thigh 12/02/2015   Past Surgical History  Procedure Laterality Date  . Appendectomy     Social History   Social History  . Marital Status: Single    Spouse Name: N/A  . Number of Children: N/A  . Years of Education: N/A   Social History Main Topics  . Smoking status: Light Tobacco Smoker    Types: Cigarettes  . Smokeless tobacco: None  . Alcohol Use: Yes  . Drug Use: Yes    Special: Marijuana  . Sexual Activity: Not Asked   Other Topics Concern  . None   Social History Narrative   History reviewed. No pertinent family history.  he reports a family history of cardiac disease although he does not have any heart problems. No Known Allergies Prior to Admission medications   Not on File     Positive ROS: All other systems have been reviewed and were otherwise negative with the exception of those mentioned in the HPI and as above.  Physical Exam: General: Alert, no acute distress Cardiovascular: No pedal edema Respiratory: No cyanosis, no use of accessory musculature GI: No organomegaly, abdomen is soft and non-tender Skin: He  has a punctate area that may be from a penetrating wound over the superomedial aspect of the right knee. He has surrounding cellulitis that is acsending up the thigh and down the leg. Neurologic: Sensation intact distally Psychiatric: Patient is competent for consent with normal mood and affect Lymphatic: No axillary or cervical lymphadenopathy  MUSCULOSKELETAL: Right leg EHL and FHL are firing. He does not feel like he has an effusion, range of motion is from 10 to 60. He has a palpable fluid collection over the superomedial aspect of the knee, concerning for septic abscess formation.  Apparently the emergency room physician at [facility] was unable to obtain any significant fluid from the knee joint, consistent with a dry tap.  Assessment: Right knee abscess formation, probably involving skin, subcutaneous tissue, and muscle.   Plan: Plan for right knee incision, irrigation and debridement, likely tomorrow. He will be nothing by mouth after midnight tonight. We will begin IV antibiotics. These already been ordered from pharmacy, but he still does not have an IV in place.  The risks benefits and alternatives were discussed with the patient including but not limited to the risks of nonoperative treatment, versus surgical intervention including infection, bleeding, nerve injury,  blood clots, cardiopulmonary complications, morbidity, mortality, among others, and they were willing to proceed. We have also discussed the potential need for multiple surgical debridements. We will see the extent of his infection tomorrow during surgery.  Matthew Bryant  P, MD Cell [phone]   12/02/2015 9:50 PM

## 2015-12-02 NOTE — Progress Notes (Signed)
Pt arrived to unit at 645pm. Paged answering service for orders

## 2015-12-02 NOTE — ED Notes (Signed)
MD at bedside. 

## 2015-12-03 ENCOUNTER — Encounter (HOSPITAL_COMMUNITY)
Admission: EM | Disposition: A | Discharge status: Home / Self Care | Payer: Self-pay | Source: Home / Self Care | Attending: Orthopedic Surgery

## 2015-12-03 ENCOUNTER — Inpatient Hospital Stay (HOSPITAL_COMMUNITY): Payer: Self-pay | Admitting: Certified Registered Nurse Anesthetist

## 2015-12-03 ENCOUNTER — Inpatient Hospital Stay (HOSPITAL_COMMUNITY): Admission: RE | Admit: 2015-12-03 | Payer: Self-pay | Source: Ambulatory Visit | Admitting: Orthopedic Surgery

## 2015-12-03 DIAGNOSIS — L02415 Cutaneous abscess of right lower limb: Secondary | ICD-10-CM | POA: Diagnosis present

## 2015-12-03 HISTORY — PX: I&D EXTREMITY: SHX5045

## 2015-12-03 LAB — BASIC METABOLIC PANEL
Anion gap: 6 (ref 5–15)
BUN: 6 mg/dL (ref 6–20)
CHLORIDE: 105 mmol/L (ref 101–111)
CO2: 27 mmol/L (ref 22–32)
CREATININE: 1.1 mg/dL (ref 0.61–1.24)
Calcium: 8.7 mg/dL — ABNORMAL LOW (ref 8.9–10.3)
Glucose, Bld: 103 mg/dL — ABNORMAL HIGH (ref 65–99)
POTASSIUM: 4.1 mmol/L (ref 3.5–5.1)
SODIUM: 138 mmol/L (ref 135–145)

## 2015-12-03 LAB — SURGICAL PCR SCREEN
MRSA, PCR: POSITIVE — AB
Staphylococcus aureus: POSITIVE — AB

## 2015-12-03 LAB — SEDIMENTATION RATE: Sed Rate: 25 mm/hr — ABNORMAL HIGH (ref 0–16)

## 2015-12-03 SURGERY — IRRIGATION AND DEBRIDEMENT EXTREMITY
Anesthesia: General | Site: Knee | Laterality: Right

## 2015-12-03 MED ORDER — PROMETHAZINE HCL 25 MG/ML IJ SOLN: 6.2500 mg | INTRAMUSCULAR | Status: DC | PRN

## 2015-12-03 MED ORDER — PROPOFOL 10 MG/ML IV BOLUS
INTRAVENOUS | Status: DC | PRN
  Administered 2015-12-03: 200 mg via INTRAVENOUS

## 2015-12-03 MED ORDER — SULFAMETHOXAZOLE-TRIMETHOPRIM 400-80 MG PO TABS: 1.0000 | ORAL_TABLET | Freq: Two times a day (BID) | ORAL | Status: DC

## 2015-12-03 MED ORDER — HYDROMORPHONE HCL 1 MG/ML IJ SOLN
INTRAMUSCULAR | Status: AC
  Administered 2015-12-03: 0.5 mg via INTRAVENOUS
  Filled 2015-12-03: qty 2

## 2015-12-03 MED ORDER — MEPERIDINE HCL 25 MG/ML IJ SOLN: 6.2500 mg | INTRAMUSCULAR | Status: DC | PRN

## 2015-12-03 MED ORDER — SODIUM CHLORIDE 0.9 % IR SOLN
Status: DC | PRN
  Administered 2015-12-03: 1000 mL

## 2015-12-03 MED ORDER — DOXYCYCLINE HYCLATE 50 MG PO CAPS: 50.0000 mg | ORAL_CAPSULE | Freq: Two times a day (BID) | ORAL | Status: DC

## 2015-12-03 MED ORDER — CHLORHEXIDINE GLUCONATE CLOTH 2 % EX PADS
6.0000 | MEDICATED_PAD | Freq: Every day | CUTANEOUS | Status: DC
  Administered 2015-12-04: 6 via TOPICAL

## 2015-12-03 MED ORDER — ONDANSETRON HCL 4 MG/2ML IJ SOLN
INTRAMUSCULAR | Status: DC | PRN
  Administered 2015-12-03: 4 mg via INTRAVENOUS

## 2015-12-03 MED ORDER — MUPIROCIN 2 % EX OINT
1.0000 "application " | TOPICAL_OINTMENT | Freq: Two times a day (BID) | CUTANEOUS | Status: DC
  Administered 2015-12-03 – 2015-12-05 (×5): 1 via NASAL
  Filled 2015-12-03: qty 22

## 2015-12-03 MED ORDER — LACTATED RINGERS IV SOLN
INTRAVENOUS | Status: DC
  Administered 2015-12-03: 12:00:00 via INTRAVENOUS

## 2015-12-03 MED ORDER — FENTANYL CITRATE (PF) 100 MCG/2ML IJ SOLN
INTRAMUSCULAR | Status: DC | PRN
  Administered 2015-12-03: 25 ug via INTRAVENOUS
  Administered 2015-12-03 (×5): 50 ug via INTRAVENOUS
  Administered 2015-12-03: 25 ug via INTRAVENOUS

## 2015-12-03 MED ORDER — LIDOCAINE HCL (CARDIAC) 20 MG/ML IV SOLN
INTRAVENOUS | Status: DC | PRN
  Administered 2015-12-03: 100 mg via INTRAVENOUS

## 2015-12-03 MED ORDER — OXYCODONE HCL 5 MG PO TABS: 5.0000 mg | ORAL_TABLET | ORAL | Status: DC | PRN

## 2015-12-03 MED ORDER — DIPHENHYDRAMINE HCL 50 MG/ML IJ SOLN
INTRAMUSCULAR | Status: DC | PRN
  Administered 2015-12-03: 25 mg via INTRAVENOUS

## 2015-12-03 MED ORDER — HYDROMORPHONE HCL 1 MG/ML IJ SOLN
0.5000 mg | INTRAMUSCULAR | Status: DC | PRN
  Administered 2015-12-03 (×3): 0.5 mg via INTRAVENOUS

## 2015-12-03 MED ORDER — SODIUM CHLORIDE 0.9 % IR SOLN
Status: DC | PRN
  Administered 2015-12-03 (×2): 3000 mL

## 2015-12-03 MED ORDER — MIDAZOLAM HCL 5 MG/5ML IJ SOLN
INTRAMUSCULAR | Status: DC | PRN
  Administered 2015-12-03: 2 mg via INTRAVENOUS

## 2015-12-03 SURGICAL SUPPLY — 50 items
BANDAGE ELASTIC 4 VELCRO ST LF (GAUZE/BANDAGES/DRESSINGS) IMPLANT
BANDAGE ELASTIC 6 VELCRO ST LF (GAUZE/BANDAGES/DRESSINGS) IMPLANT
BNDG COHESIVE 4X5 TAN STRL (GAUZE/BANDAGES/DRESSINGS) ×3 IMPLANT
BNDG ELASTIC 6X10 VLCR STRL LF (GAUZE/BANDAGES/DRESSINGS) ×3 IMPLANT
BNDG GAUZE ELAST 4 BULKY (GAUZE/BANDAGES/DRESSINGS) ×3 IMPLANT
BOOTCOVER CLEANROOM LRG (PROTECTIVE WEAR) ×3 IMPLANT
COVER SURGICAL LIGHT HANDLE (MISCELLANEOUS) ×3 IMPLANT
CUFF TOURNIQUET SINGLE 34IN LL (TOURNIQUET CUFF) IMPLANT
DURAPREP 26ML APPLICATOR (WOUND CARE) ×6 IMPLANT
ELECT REM PT RETURN 9FT ADLT (ELECTROSURGICAL) ×3
ELECTRODE REM PT RTRN 9FT ADLT (ELECTROSURGICAL) ×1 IMPLANT
EVACUATOR 1/8 PVC DRAIN (DRAIN) IMPLANT
GAUZE IODOFORM PACK 1/2 7832 (GAUZE/BANDAGES/DRESSINGS) ×3 IMPLANT
GAUZE SPONGE 4X4 12PLY STRL (GAUZE/BANDAGES/DRESSINGS) IMPLANT
GAUZE XEROFORM 1X8 LF (GAUZE/BANDAGES/DRESSINGS) IMPLANT
GLOVE BIOGEL PI IND STRL 7.0 (GLOVE) ×1 IMPLANT
GLOVE BIOGEL PI IND STRL 7.5 (GLOVE) ×2 IMPLANT
GLOVE BIOGEL PI INDICATOR 7.0 (GLOVE) ×2
GLOVE BIOGEL PI INDICATOR 7.5 (GLOVE) ×4
GLOVE BIOGEL PI ORTHO PRO SZ8 (GLOVE) ×4
GLOVE ORTHO TXT STRL SZ7.5 (GLOVE) ×3 IMPLANT
GLOVE PI ORTHO PRO STRL SZ8 (GLOVE) ×2 IMPLANT
GLOVE SURG ORTHO 8.0 STRL STRW (GLOVE) ×3 IMPLANT
GLOVE SURG SS PI 7.0 STRL IVOR (GLOVE) ×3 IMPLANT
GLOVE SURG SYN 8.0 (GLOVE) ×6 IMPLANT
GOWN STRL REUS W/ TWL LRG LVL3 (GOWN DISPOSABLE) ×2 IMPLANT
GOWN STRL REUS W/TWL LRG LVL3 (GOWN DISPOSABLE) ×4
HANDPIECE INTERPULSE COAX TIP (DISPOSABLE)
KIT BASIN OR (CUSTOM PROCEDURE TRAY) ×3 IMPLANT
KIT ROOM TURNOVER OR (KITS) ×3 IMPLANT
MANIFOLD NEPTUNE II (INSTRUMENTS) ×3 IMPLANT
NEEDLE 18GX1X1/2 (RX/OR ONLY) (NEEDLE) ×3 IMPLANT
NS IRRIG 1000ML POUR BTL (IV SOLUTION) ×3 IMPLANT
PACK ORTHO EXTREMITY (CUSTOM PROCEDURE TRAY) ×3 IMPLANT
PAD ARMBOARD 7.5X6 YLW CONV (MISCELLANEOUS) ×3 IMPLANT
SET HNDPC FAN SPRY TIP SCT (DISPOSABLE) IMPLANT
SET IRRIG Y TYPE TUR BLADDER L (SET/KITS/TRAYS/PACK) ×3 IMPLANT
SPONGE GAUZE 4X4 12PLY STER LF (GAUZE/BANDAGES/DRESSINGS) ×3 IMPLANT
SPONGE LAP 18X18 X RAY DECT (DISPOSABLE) ×3 IMPLANT
STOCKINETTE IMPERVIOUS 9X36 MD (GAUZE/BANDAGES/DRESSINGS) ×3 IMPLANT
SUT ETHILON 3 0 PS 1 (SUTURE) ×3 IMPLANT
SYRINGE 10CC LL (SYRINGE) ×6 IMPLANT
TOWEL OR 17X24 6PK STRL BLUE (TOWEL DISPOSABLE) ×3 IMPLANT
TOWEL OR 17X26 10 PK STRL BLUE (TOWEL DISPOSABLE) ×3 IMPLANT
TUBE ANAEROBIC SPECIMEN COL (MISCELLANEOUS) IMPLANT
TUBE CONNECTING 12'X1/4 (SUCTIONS) ×1
TUBE CONNECTING 12X1/4 (SUCTIONS) ×2 IMPLANT
UNDERPAD 30X30 INCONTINENT (UNDERPADS AND DIAPERS) ×3 IMPLANT
WATER STERILE IRR 1000ML POUR (IV SOLUTION) IMPLANT
YANKAUER SUCT BULB TIP NO VENT (SUCTIONS) ×3 IMPLANT

## 2015-12-03 NOTE — Progress Notes (Signed)
The patient has been re-examined, and the chart reviewed, and there have been no interval changes to the documented history and physical.    The risks, benefits, and alternatives have been discussed at length, and the patient is willing to proceed.    MRSA screen positive, c/w VANC.    Will take cultures and also check knee aspirate again intraop.    Eulas PostLANDAU,JOSHUA P, MD

## 2015-12-03 NOTE — Anesthesia Procedure Notes (Signed)
Procedure Name: LMA Insertion Date/Time: 12/03/2015 12:21 PM Performed by: Fabian NovemberSOLHEIM, Matthew Bryant Pre-anesthesia Checklist: Patient identified, Patient being monitored, Timeout performed, Emergency Drugs available and Suction available Patient Re-evaluated:Patient Re-evaluated prior to inductionOxygen Delivery Method: Circle System Utilized Preoxygenation: Pre-oxygenation with 100% oxygen Intubation Type: IV induction Ventilation: Mask ventilation without difficulty LMA: LMA inserted LMA Size: 4.0 Number of attempts: 1 Placement Confirmation: positive ETCO2 and breath sounds checked- equal and bilateral Tube secured with: Tape Dental Injury: Teeth and Oropharynx as per pre-operative assessment

## 2015-12-03 NOTE — Transfer of Care (Signed)
Immediate Anesthesia Transfer of Care Note  Patient: Matthew Bryant  Procedure(s) Performed: Procedure(s): IRRIGATION AND DEBRIDEMENT RIGHT LEG (Right)  Patient Location: PACU  Anesthesia Type:General  Level of Consciousness: awake, alert , oriented and patient cooperative  Airway & Oxygen Therapy: Patient Spontanous Breathing and Patient connected to nasal cannula oxygen  Post-op Assessment: Report given to RN and Post -op Vital signs reviewed and stable  Post vital signs: Reviewed and stable  Last Vitals:  Filed Vitals:   12/02/15 2054 12/03/15 0501  BP: 131/93 112/66  Pulse: 63 60  Temp: 36.9 C 36.8 C  Resp: 18 18    Complications: No apparent anesthesia complications

## 2015-12-03 NOTE — Anesthesia Postprocedure Evaluation (Signed)
Anesthesia Post Note  Patient: Matthew Bryant, technical salesDewayne Awe  Procedure(s) Performed: Procedure(s) (LRB): IRRIGATION AND DEBRIDEMENT RIGHT LEG (Right)  Patient location during evaluation: PACU Anesthesia Type: General Level of consciousness: sedated and patient cooperative Pain management: pain level controlled Vital Signs Assessment: post-procedure vital signs reviewed and stable Respiratory status: spontaneous breathing Cardiovascular status: stable Anesthetic complications: no    Last Vitals:  Filed Vitals:   12/03/15 1404 12/03/15 1410  BP: 136/86   Pulse: 51   Temp:  36.4 C  Resp: 12     Last Pain:  Filed Vitals:   12/03/15 1411  PainSc: 5                  Lewie LoronJohn Marki Frede

## 2015-12-03 NOTE — Discharge Instructions (Signed)
Change dressing daily with wet to dry dressing with clean gauze.   Abscess An abscess is an infected area that contains a collection of pus and debris.It can occur in almost any part of the body. An abscess is also known as a furuncle or boil. CAUSES  An abscess occurs when tissue gets infected. This can occur from blockage of oil or sweat glands, infection of hair follicles, or a minor injury to the skin. As the body tries to fight the infection, pus collects in the area and creates pressure under the skin. This pressure causes pain. People with weakened immune systems have difficulty fighting infections and get certain abscesses more often.  SYMPTOMS Usually an abscess develops on the skin and becomes a painful mass that is red, warm, and tender. If the abscess forms under the skin, you may feel a moveable soft area under the skin. Some abscesses break open (rupture) on their own, but most will continue to get worse without care. The infection can spread deeper into the body and eventually into the bloodstream, causing you to feel ill.  DIAGNOSIS  Your caregiver will take your medical history and perform a physical exam. A sample of fluid may also be taken from the abscess to determine what is causing your infection. TREATMENT  Your caregiver may prescribe antibiotic medicines to fight the infection. However, taking antibiotics alone usually does not cure an abscess. Your caregiver may need to make a small cut (incision) in the abscess to drain the pus. In some cases, gauze is packed into the abscess to reduce pain and to continue draining the area. HOME CARE INSTRUCTIONS   Only take over-the-counter or prescription medicines for pain, discomfort, or fever as directed by your caregiver.  If you were prescribed antibiotics, take them as directed. Finish them even if you start to feel better.  If gauze is used, follow your caregiver's directions for changing the gauze.  To avoid spreading the  infection:  Keep your draining abscess covered with a bandage.  Wash your hands well.  Do not share personal care items, towels, or whirlpools with others.  Avoid skin contact with others.  Keep your skin and clothes clean around the abscess.  Keep all follow-up appointments as directed by your caregiver. SEEK MEDICAL CARE IF:   You have increased pain, swelling, redness, fluid drainage, or bleeding.  You have muscle aches, chills, or a general ill feeling.  You have a fever. MAKE SURE YOU:   Understand these instructions.  Will watch your condition.  Will get help right away if you are not doing well or get worse.   This information is not intended to replace advice given to you by your health care provider. Make sure you discuss any questions you have with your health care provider.   Document Released: 09/01/2005 Document Revised: 05/23/2012 Document Reviewed: 02/04/2012 Elsevier Interactive Patient Education [employer]2016 [employer] Inc[employer]

## 2015-12-03 NOTE — Anesthesia Preprocedure Evaluation (Addendum)
Anesthesia Evaluation  Patient identified by MRN, date of birth, ID band Patient awake    Reviewed: Allergy & Precautions, NPO status , Patient's Chart, lab work & pertinent test results  History of Anesthesia Complications Negative for: history of anesthetic complications  Airway Mallampati: II  TM Distance: >3 FB Neck ROM: Full    Dental no notable dental hx. (+) Dental Advisory Given,    Pulmonary Current Smoker,  Light Smoker Marijuana Use   Pulmonary exam normal breath sounds clear to auscultation       Cardiovascular Exercise Tolerance: Good negative cardio ROS Normal cardiovascular exam Rhythm:Regular Rate:Normal     Neuro/Psych PSYCHIATRIC DISORDERS Anxiety Marijuana helps "keep me even with my nerves"negative neurological ROS     GI/Hepatic negative GI ROS, Neg liver ROS,   Endo/Other  negative endocrine ROS  Renal/GU negative Renal ROS  negative genitourinary   Musculoskeletal negative musculoskeletal ROS (+) Neck Pain   Abdominal (+)  Abdomen: soft. Bowel sounds: normal.  Peds negative pediatric ROS (+)  Hematology negative hematology ROS (+)   Anesthesia Other Findings   Reproductive/Obstetrics negative OB ROS                          BP Readings from Last 3 Encounters:  12/03/15 112/66  10/20/15 126/77  02/20/15 137/82   Lab Results  Component Value Date   WBC 10.6* 12/02/2015   HGB 13.9 12/02/2015   HCT 41.6 12/02/2015   MCV 94.5 12/02/2015   PLT 285 12/02/2015     Chemistry      Component Value Date/Time   NA 138 12/03/2015 0646   K 4.1 12/03/2015 0646   CL 105 12/03/2015 0646   CO2 27 12/03/2015 0646   BUN 6 12/03/2015 0646   CREATININE 1.10 12/03/2015 0646      Component Value Date/Time   CALCIUM 8.7* 12/03/2015 0646   ALKPHOS 65 12/02/2015 1520   AST 19 12/02/2015 1520   ALT 19 12/02/2015 1520   BILITOT 0.5 12/02/2015 1520     EKG 11/28/13: SB   Anesthesia Physical Anesthesia Plan  ASA: II  Anesthesia Plan: General   Post-op Pain Management:    Induction: Intravenous  Airway Management Planned: LMA  Additional Equipment:   Intra-op Plan:   Post-operative Plan: Extubation in OR  Informed Consent: I have reviewed the patients History and Physical, chart, labs and discussed the procedure including the risks, benefits and alternatives for the proposed anesthesia with the patient or authorized representative who has indicated his/her understanding and acceptance.   Dental advisory given  Plan Discussed with: CRNA  Anesthesia Plan Comments:         Anesthesia Quick Evaluation

## 2015-12-03 NOTE — Op Note (Signed)
12/02/2015 - 12/03/2015  12:59 PM  PATIENT:  Matthew Bryant    PRE-OPERATIVE DIAGNOSIS:  Right Leg Abscess  POST-OPERATIVE DIAGNOSIS:  Same  PROCEDURE:  IRRIGATION AND DEBRIDEMENT RIGHT THIGH ABSCESS INVOLVING SKIN, SUBCUTANEOUS TISSUE, FASCIA, USING A KNIFE, CURETTE, SCISSORS, AND COBB WITH ASPIRATION OF RIGHT KNEE  SURGEON:  Eulas PostLANDAU,Garet Hooton P, MD  PHYSICIAN ASSISTANT: Janace LittenBrandon Parry, OPA-C, present and scrubbed throughout the case, critical for completion in a timely fashion, and for retraction, instrumentation, and closure.  ANESTHESIA:   General  PREOPERATIVE INDICATIONS:  [name] is a  49 y.o. male with a diagnosis of Right Leg Abscess who failed conservative measures and elected for surgical management.  He tested preoperatively positive for MRSA on PCR swab of his nares.  The risks benefits and alternatives were discussed with the patient preoperatively including but not limited to the risks of infection, bleeding, nerve injury, cardiopulmonary complications, the need for revision surgery, among others, and the patient was willing to proceed.  OPERATIVE IMPLANTS: Iodoform gauze packing  OPERATIVE FINDINGS: Loculated abscess in the subcutaneous tissue tracking down to the superficial fascia, did not appear to be involving the knee joint itself. The joint fluid aspirated was normal-appearing joint fluid without any turbulence or evidence for infection clinically. The abscess remained in the subcutaneous tissue anterior to the quadriceps but did track down to the tendon itself but did not violate the tendon.  OPERATIVE PROCEDURE: The patient was brought to the operating room and placed in the supine position. Gen. anesthesia was administered. IV antibiotics were given. He was receiving IV vancomycin given his preoperative MRSA screen, and likely has a MRSA abscess. The right lower extremity was prepped and draped in usual sterile fashion. Time out was performed. The superolateral  aspect of the knee did not have any violation of the skin, and appeared to be a clear path for aspiration, and with an 18-gauge needle I aspirated a total of about 7 mL of normal-appearing joint fluid.  The anterior medial aspect superiorly of the knee was then incised directly in line with the limb, and dissection carried down in a subcutaneous abscess was encountered. The tract was excised sharply with a knife, and I also excised some of the subcutaneous tissue with a knife, then used a curette, as well as a scissors, to remove any necrotic-appearing tissue. There was a loculated lesion, and I broke down as much of the walls as possible as I could find. After complete debridement was carried out I irrigated a total of 6 L of saline through the wound using cystoscopy tubing.  I then placed a single stitch superiorly, left the distal wound open to drain, packed it with iodoform gauze followed by sterile dressing. Deep cultures were taken, as well as the fluid from the knee, the patient was awakened and returned to the PACU in stable and satisfactory condition. There were no complications and he tolerated the procedure well.

## 2015-12-04 ENCOUNTER — Encounter (HOSPITAL_COMMUNITY): Payer: Self-pay | Admitting: Orthopedic Surgery

## 2015-12-04 NOTE — Progress Notes (Signed)
Patient ID: Matthew Bryant, male   DOB: Sep 10, 1966, 49 y.o.   MRN: [id number]     Subjective:  Patient reports pain as mild to moderate.  Patient feeling better and in no acute distress.  Denies any CP or SOB also denies chills  Objective:   VITALS:   Filed Vitals:   12/03/15 2022 12/04/15 0010 12/04/15 0510 12/04/15 1507  BP: 113/65 117/81 121/81 130/76  Pulse: 68 59 60 64  Temp: 98.4 F (36.9 C) 98.6 F (37 C) 98.4 F (36.9 C) 98.3 F (36.8 C)  TempSrc: Oral Oral Oral Oral  Resp: 16 16 16 16   Height:      Weight:      SpO2: 99% 100% 100% 99%    ABD soft Sensation intact distally Dorsiflexion/Plantar flexion intact Incision: dressing C/D/I and no drainage Packing removed and clean dressing applied   Lab Results  Component Value Date   WBC 10.6* 12/02/2015   HGB 13.9 12/02/2015   HCT 41.6 12/02/2015   MCV 94.5 12/02/2015   PLT 285 12/02/2015   BMET    Component Value Date/Time   NA 138 12/03/2015 0646   K 4.1 12/03/2015 0646   CL 105 12/03/2015 0646   CO2 27 12/03/2015 0646   GLUCOSE 103* 12/03/2015 0646   BUN 6 12/03/2015 0646   CREATININE 1.10 12/03/2015 0646   CALCIUM 8.7* 12/03/2015 0646   GFRNONAA >60 12/03/2015 0646   GFRAA >60 12/03/2015 0646     Assessment/Plan: 1 Day Post-Op   Principal Problem:   Infected blister of right thigh Active Problems:   Cellulitis   Blister of thigh with infection   Abscess of right thigh   Advance diet Up with therapy Dry dressing PRN WBAT    DOUGLAS PARRY, BRANDON 12/04/2015, 3:55 PM  Discussed and agree with above.    Teryl LucyJoshua Compton Brigance, MD Cell [phone]

## 2015-12-05 MED ORDER — INFLUENZA VAC SPLIT QUAD 0.5 ML IM SUSY
0.5000 mL | PREFILLED_SYRINGE | INTRAMUSCULAR | Status: AC
  Administered 2015-12-05: 0.5 mL via INTRAMUSCULAR
  Filled 2015-12-05: qty 0.5

## 2015-12-05 NOTE — Care Management Note (Signed)
Case Management Note  Patient Details  Name: Matthew Bryant MRN: [id number] Date of Birth: 1966/09/17  Subjective/Objective:         Admitted with right thigh abscess           Action/Plan: [facility] [place]. Spoke with patient about discharge plan, gave patient cost of antibiotics,he states that he will be able to afford meds. Gave him pharmacy discount card.      Expected Discharge Date:                  Expected Discharge Plan:  Home/Self Care  In-House Referral:     Discharge planning Services  CM Consult  Post Acute Care Choice:  NA Choice offered to:  NA  DME Arranged:    DME Agency:     HH Arranged:    HH Agency:     Status of Service:  Completed, signed off  Medicare Important Message Given:    Date Medicare IM Given:    Medicare IM give by:    Date Additional Medicare IM Given:    Additional Medicare Important Message give by:     If discussed at Long Length of Stay Meetings, dates discussed:    Additional Comments:  Monica BectonKrieg, Joncarlos Atkison Watson, RN 12/05/2015, 11:28 AM

## 2015-12-05 NOTE — Discharge Summary (Signed)
Physician Discharge Summary  Patient ID: Matthew Bryant MRN: [id number] DOB/AGE: 1966-09-01 49 y.o.  Admit date: 12/02/2015 Discharge date: 12/05/2015  Admission Diagnoses:  Infected blister of right thigh  Discharge Diagnoses:  Principal Problem:   Infected blister of right thigh Active Problems:   Cellulitis   Blister of thigh with infection   Abscess of right thigh   Past Medical History  Diagnosis Date  . Neck pain   . Infected blister of right thigh 12/02/2015    Surgeries: Procedure(s): IRRIGATION AND DEBRIDEMENT RIGHT LEG on 12/02/2015 - 12/03/2015   Consultants (if any):    Discharged Condition: Improved  Hospital Course: Matthew Bryant is an 49 y.o. male who was admitted 12/02/2015 with a diagnosis of Infected blister of right thigh and went to the operating room on 12/02/2015 - 12/03/2015 and underwent the above named procedures.  Packing removed POD 1 and dressings changed and wounds much improved.  He was given perioperative antibiotics:  Anti-infectives    Start     Dose/Rate Route Frequency Ordered Stop   12/03/15 1600  cefTRIAXone (ROCEPHIN) 2 g in dextrose 5 % 50 mL IVPB  Status:  Discontinued     2 g 100 mL/hr over 30 Minutes Intravenous Every 24 hours 12/02/15 2003 12/03/15 1441   12/03/15 0500  vancomycin (VANCOCIN) IVPB 1000 mg/200 mL premix     1,000 mg 200 mL/hr over 60 Minutes Intravenous Every 8 hours 12/02/15 2002     12/03/15 0000  sulfamethoxazole-trimethoprim (BACTRIM) 400-80 MG tablet     1 tablet Oral 2 times daily 12/03/15 1308     12/03/15 0000  doxycycline (VIBRAMYCIN) 50 MG capsule     50 mg Oral 2 times daily 12/03/15 1308     12/02/15 2015  vancomycin (VANCOCIN) 1,500 mg in sodium chloride 0.9 % 500 mL IVPB     1,500 mg 250 mL/hr over 120 Minutes Intravenous  Once 12/02/15 2000 12/03/15 0101   12/02/15 2000  vancomycin (VANCOCIN) IVPB 1000 mg/200 mL premix  Status:  Discontinued     1,000 mg 200 mL/hr over 60 Minutes  Intravenous  Once 12/02/15 1947 12/02/15 1959   12/02/15 1539  cefTRIAXone (ROCEPHIN) 2 g injection    Comments:  Cothren, Julie   : cabinet override      12/02/15 1539 12/02/15 1558   12/02/15 1530  cefTRIAXone (ROCEPHIN) 2 g in dextrose 5 % 50 mL IVPB  Status:  Discontinued     2 g 100 mL/hr over 30 Minutes Intravenous  Once 12/02/15 1521 12/03/15 1201    .  He was given sequential compression devices, early ambulation for DVT prophylaxis.  He benefited maximally from the hospital stay and there were no complications.    Recent vital signs:  Filed Vitals:   12/04/15 1507 12/05/15 0728  BP: 130/76 137/73  Pulse: 64 64  Temp: 98.3 F (36.8 C) 98.4 F (36.9 C)  Resp: 16 16    Recent laboratory studies:  Lab Results  Component Value Date   HGB 13.9 12/02/2015   HGB 14.1 10/20/2015   HGB 15.0 11/23/2013   Lab Results  Component Value Date   WBC 10.6* 12/02/2015   PLT 285 12/02/2015   No results found for: INR Lab Results  Component Value Date   NA 138 12/03/2015   K 4.1 12/03/2015   CL 105 12/03/2015   CO2 27 12/03/2015   BUN 6 12/03/2015   CREATININE 1.10 12/03/2015   GLUCOSE 103* 12/03/2015    Discharge  Medications:     Medication List    TAKE these medications        doxycycline 50 MG capsule  Commonly known as:  VIBRAMYCIN  Take 1 capsule (50 mg total) by mouth 2 (two) times daily.     oxyCODONE 5 MG immediate release tablet  Commonly known as:  ROXICODONE  Take 1 tablet (5 mg total) by mouth every 4 (four) hours as needed for severe pain.     sulfamethoxazole-trimethoprim 400-80 MG tablet  Commonly known as:  BACTRIM  Take 1 tablet by mouth 2 (two) times daily.        Diagnostic Studies: Dg Knee Complete 4 Views Right  12/02/2015  CLINICAL DATA:  Swelling and redness to RIGHT knee question insect bite, generalized fatigue, weakness EXAM: RIGHT KNEE - COMPLETE 4+ VIEW COMPARISON:  None FINDINGS: Scattered soft tissue swelling. Osseous  mineralization grossly normal for technique. Joint spaces preserved. No acute fracture, dislocation or bone destruction. Metallic foreign body 3 mm length projects anterolaterally at the level of the knee joint. No knee joint effusion. IMPRESSION: Soft tissue swelling without acute bony abnormalities. Metallic foreign body in the soft tissues of the anterior lateral knee. Electronically Signed   By: Ulyses SouthwardMark  Boles M.D.   On: 12/02/2015 16:46    Disposition: 01-Home or Self Care        Follow-up Information    Follow up with Eulas PostLANDAU,Aspen Lawrance P, MD. Schedule an appointment as soon as possible for a visit in 1 week.   Specialty:  Orthopedic Surgery   Contact information:   [address] [place] [address] [phone]        Signed: Eulas PostLANDAU,Michalle Rademaker P 12/05/2015, 10:58 AM

## 2015-12-05 NOTE — Progress Notes (Signed)
Utilization review completed.  

## 2015-12-05 NOTE — Progress Notes (Signed)
Pt ready for discharge. IV removed and all belongings gathered. Discharge education/instructions reviewed with pt and all questions/concerns addressed. Will be transported out via wheelchair to wife's car. Will continue to monitor.

## 2015-12-06 LAB — CULTURE, ROUTINE-ABSCESS

## 2015-12-06 LAB — TISSUE CULTURE

## 2015-12-06 LAB — BODY FLUID CULTURE: CULTURE: NO GROWTH

## 2015-12-07 LAB — CULTURE, BLOOD (ROUTINE X 2)
CULTURE: NO GROWTH
Culture: NO GROWTH

## 2015-12-09 LAB — ANAEROBIC CULTURE

## 2016-12-17 ENCOUNTER — Emergency Department (HOSPITAL_COMMUNITY)
Admission: EM | Admit: 2016-12-17 | Discharge: 2016-12-17 | Disposition: A | Discharge status: Home / Self Care | Payer: Self-pay | Attending: Emergency Medicine | Admitting: Emergency Medicine

## 2016-12-17 ENCOUNTER — Emergency Department (HOSPITAL_COMMUNITY): Payer: Self-pay

## 2016-12-17 ENCOUNTER — Encounter (HOSPITAL_COMMUNITY): Payer: Self-pay | Admitting: *Deleted

## 2016-12-17 DIAGNOSIS — S99912A Unspecified injury of left ankle, initial encounter: Secondary | ICD-10-CM | POA: Insufficient documentation

## 2016-12-17 DIAGNOSIS — W11XXXA Fall on and from ladder, initial encounter: Secondary | ICD-10-CM | POA: Insufficient documentation

## 2016-12-17 DIAGNOSIS — Y929 Unspecified place or not applicable: Secondary | ICD-10-CM | POA: Insufficient documentation

## 2016-12-17 DIAGNOSIS — W19XXXA Unspecified fall, initial encounter: Secondary | ICD-10-CM

## 2016-12-17 DIAGNOSIS — Y939 Activity, unspecified: Secondary | ICD-10-CM | POA: Insufficient documentation

## 2016-12-17 DIAGNOSIS — F17210 Nicotine dependence, cigarettes, uncomplicated: Secondary | ICD-10-CM | POA: Insufficient documentation

## 2016-12-17 DIAGNOSIS — Y999 Unspecified external cause status: Secondary | ICD-10-CM | POA: Insufficient documentation

## 2016-12-17 DIAGNOSIS — Z79899 Other long term (current) drug therapy: Secondary | ICD-10-CM | POA: Insufficient documentation

## 2016-12-17 DIAGNOSIS — S92002A Unspecified fracture of left calcaneus, initial encounter for closed fracture: Secondary | ICD-10-CM | POA: Insufficient documentation

## 2016-12-17 LAB — BASIC METABOLIC PANEL
Anion gap: 8 (ref 5–15)
BUN: 8 mg/dL (ref 6–20)
CHLORIDE: 105 mmol/L (ref 101–111)
CO2: 23 mmol/L (ref 22–32)
CREATININE: 1.09 mg/dL (ref 0.61–1.24)
Calcium: 9.2 mg/dL (ref 8.9–10.3)
GFR calc Af Amer: >60 mL/min (ref >60)
GFR calc non Af Amer: >60 mL/min (ref >60)
GLUCOSE: 99 mg/dL (ref 65–99)
Potassium: 4.4 mmol/L (ref 3.5–5.1)
Sodium: 136 mmol/L (ref 135–145)

## 2016-12-17 LAB — CBC WITH DIFFERENTIAL/PLATELET
Basophils Absolute: 0 10*3/uL (ref 0.0–0.1)
Basophils Relative: 0 %
Eosinophils Absolute: 0.1 10*3/uL (ref 0.0–0.7)
Eosinophils Relative: 1 %
HEMATOCRIT: 42.5 % (ref 39.0–52.0)
HEMOGLOBIN: 14.4 g/dL (ref 13.0–17.0)
LYMPHS ABS: 1.9 10*3/uL (ref 0.7–4.0)
Lymphocytes Relative: 16 %
MCH: 32.3 pg (ref 26.0–34.0)
MCHC: 33.9 g/dL (ref 30.0–36.0)
MCV: 95.3 fL (ref 78.0–100.0)
MONOS PCT: 7 %
Monocytes Absolute: 0.9 10*3/uL (ref 0.1–1.0)
NEUTROS ABS: 8.9 10*3/uL — AB (ref 1.7–7.7)
NEUTROS PCT: 76 %
Platelets: 308 10*3/uL (ref 150–400)
RBC: 4.46 MIL/uL (ref 4.22–5.81)
RDW: 12.4 % (ref 11.5–15.5)
WBC: 11.8 10*3/uL — ABNORMAL HIGH (ref 4.0–10.5)

## 2016-12-17 MED ORDER — ONDANSETRON 4 MG PO TBDP
4.0000 mg | ORAL_TABLET | Freq: Once | ORAL | Status: AC
  Administered 2016-12-17: 4 mg via ORAL

## 2016-12-17 MED ORDER — IBUPROFEN 600 MG PO TABS: 600.0000 mg | ORAL_TABLET | Freq: Four times a day (QID) | ORAL | 0 refills | Status: DC | PRN

## 2016-12-17 MED ORDER — MORPHINE SULFATE (PF) 4 MG/ML IV SOLN
4.0000 mg | Freq: Once | INTRAVENOUS | Status: AC
  Administered 2016-12-17: 4 mg via INTRAVENOUS
  Filled 2016-12-17: qty 1

## 2016-12-17 MED ORDER — OXYCODONE-ACETAMINOPHEN 5-325 MG PO TABS
1.0000 | ORAL_TABLET | Freq: Once | ORAL | Status: AC
  Administered 2016-12-17: 1 via ORAL
  Filled 2016-12-17: qty 1

## 2016-12-17 MED ORDER — OXYCODONE-ACETAMINOPHEN 5-325 MG PO TABS: 2.0000 | ORAL_TABLET | ORAL | 0 refills | Status: DC | PRN

## 2016-12-17 NOTE — ED Triage Notes (Signed)
Pt reports falling off a ladder approx 10 foot today after it broke. Pt states that he and injured his left ankle. Denies LOC of head pain.pt noted to have swelling to lateral aspect of left ankle.

## 2016-12-17 NOTE — Progress Notes (Signed)
Orthopedic Tech Progress Note Patient Details:  Matthew LyonsDewayne Bryant Mar 25, 1966 [id number]  Ortho Devices Type of Ortho Device: Ace wrap, Crutches, Post (short leg) splint Ortho Device/Splint Location: LUE Ortho Device/Splint Interventions: Ordered, Application   Matthew MoccasinHughes, Matthew Bryant 12/17/2016, 4:42 PM

## 2016-12-17 NOTE — Discharge Instructions (Signed)
Take your medications as prescribed as needed for pain relief. You must remain non-weight bearing until you follow up with orthopedics next week. I recommend while you are at home continuing to rest, elevate and ice her left foot to help with swelling. Call the orthopedic office listed below on Monday to schedule a follow-up appointment for evaluation next week for further management of your calcaneal fracture. Return to emergency department if symptoms worsen or new onset of fever, redness, numbness, tingling, weakness.

## 2016-12-17 NOTE — ED Provider Notes (Signed)
PROGRESS NOTE                                                                                                                 This is a sign-out from PA Nadeau at shift change: Matthew Bryant is a 51 y.o. male presenting with fall off ladder from approximately 10 feet with left foot pain. Left foot x-ray is consistent with a calcaneal fracture, imaging of the spine is negative. Case is discussed with orthopedist Dr. Valentina GuLucy who recommends obtaining CT to further evaluate the fracture, also recommends putting him in a very well-padded posterior splint and given crutches, patient is to be made strictly nonweightbearing. Plan is to keep the patient in the ED to obtain the CAT scan and then discharged home for close follow-up with Dr. Sherlean FootLucey. Please refer to previous note for full HPI, ROS, PMH and PE.   Patient has returned from CT, images are in place but I will not keep this patient in the ED to await the imaging as this will not affect our management. He is given a well-padded posterior splint and crutches. He understands to follow with Dr. Sherlean FootLucey.  Informed by CT tech that image of the right foot was performed rather than the left foot. Case discussed with charge nurse Lowella BandyNikki who will call the patient back for imaging, CT cost will be voided, patient will not have to check back into the emergency department but go directly for imaging.  Informed by charge RN Lowella BandyNikki that this patient cannot be reached on his voicemail and his emergency contact is not answering the phone. The charge for the CAT scan will be voided, I will send a message to Dr. Valentina GuLucy that this patient will have to go for outpatient CT.    Wynetta Emeryicole Azaliah Carrero, PA-C 12/17/16 1932    Wynetta Emeryicole Charmane Protzman, PA-C 12/17/16 1933    Shaune Pollackameron Isaacs, MD 12/18/16 1213

## 2016-12-17 NOTE — ED Notes (Signed)
Patient transported to X-ray 

## 2016-12-17 NOTE — ED Notes (Signed)
Back from xray

## 2016-12-17 NOTE — ED Provider Notes (Signed)
[facility]-EMERGENCY DEPT Provider Note    By signing my name below, I, Earmon PhoenixJennifer Waddell, attest that this documentation has been prepared under the direction and in the presence of Melburn HakeNicole Nadeau, PA-C. Electronically Signed: Earmon PhoenixJennifer Waddell, ED Scribe. 12/17/16. 3:47 PM.    History   Chief Complaint Chief Complaint  Patient presents with  . Fall  . Ankle Injury   The history is provided by the patient and medical records. No language interpreter was used.    Matthew Bryant is a 51 y.o. male who presents to the Emergency Department complaining of a fall from approximately 8-9 feet from a ladder. He states the ladder collapsed and he fell on to a concrete floor. He states his left foot was the first thing down and caught all his weight. He reports associated tingling of the left foot, moderate pain and swelling of the left ankle and foot. He reports some mild anterior left knee soreness. He has taken 8 OTC Ibuprofen tablets for pain with minimal relief. Moving the ankle or trying to bear weight increases the pain. Pt denies alleviating factors. He denies numbness or weakness of the LLE, foot or ankle, bruising, wounds, head trauma, LOC, nausea, vomiting, back pain, chest tenderness, abdominal pain. He denies any previous surgery or injury to the left ankle. He denies any chronic illness or daily medications. He denies anticoagulant therapy.   Past Medical History:  Diagnosis Date  . Infected blister of right thigh 12/02/2015  . Neck pain     Patient Active Problem List   Diagnosis Date Noted  . Abscess of right thigh 12/03/2015  . Cellulitis 12/02/2015  . Infected blister of right thigh 12/02/2015  . Blister of thigh with infection 12/02/2015    Past Surgical History:  Procedure Laterality Date  . APPENDECTOMY    . I&D EXTREMITY Right 12/03/2015   Procedure: IRRIGATION AND DEBRIDEMENT RIGHT LEG;  Surgeon: Teryl LucyJoshua Landau, MD;  Location: [facility] OR;  Service: Orthopedics;  Laterality:  Right;       Home Medications    Prior to Admission medications   Medication Sig Start Date End Date Taking? Authorizing Provider  doxycycline (VIBRAMYCIN) 50 MG capsule Take 1 capsule (50 mg total) by mouth 2 (two) times daily. Patient not taking: Reported on 12/17/2016 12/03/15   Teryl LucyJoshua Landau, MD  ibuprofen (ADVIL,MOTRIN) 600 MG tablet Take 1 tablet (600 mg total) by mouth every 6 (six) hours as needed. 12/17/16   Barrett HenleNicole Elizabeth Nadeau, PA-C  oxyCODONE (ROXICODONE) 5 MG immediate release tablet Take 1 tablet (5 mg total) by mouth every 4 (four) hours as needed for severe pain. Patient not taking: Reported on 12/17/2016 12/03/15   Teryl LucyJoshua Landau, MD  oxyCODONE-acetaminophen (PERCOCET/ROXICET) 5-325 MG tablet Take 2 tablets by mouth every 4 (four) hours as needed for severe pain. 12/17/16   Barrett HenleNicole Elizabeth Nadeau, PA-C  sulfamethoxazole-trimethoprim (BACTRIM) 400-80 MG tablet Take 1 tablet by mouth 2 (two) times daily. Patient not taking: Reported on 12/17/2016 12/03/15   Teryl LucyJoshua Landau, MD    Family History No family history on file.  Social History Social History  Substance Use Topics  . Smoking status: Light Tobacco Smoker    Types: Cigarettes  . Smokeless tobacco: Not on file  . Alcohol use Yes     Allergies   Patient has no known allergies.   Review of Systems Review of Systems  Constitutional: Negative for chills and fever.  Gastrointestinal: Negative for nausea and vomiting.  Musculoskeletal: Positive for arthralgias and joint swelling. Negative  for back pain.  Skin: Negative for color change and wound.  Neurological: Negative for syncope, weakness and numbness.     Physical Exam Updated Vital Signs BP (!) 158/103 (BP Location: Left Arm)   Pulse (!) 54   Temp 98.2 F (36.8 C) (Oral)   Resp 18   SpO2 100%   Physical Exam  Constitutional: He is oriented to person, place, and time. He appears well-developed and well-nourished.  HENT:  Head: Normocephalic  and atraumatic. Head is without raccoon's eyes, without Battle's sign, without abrasion, without contusion and without laceration.  Right Ear: Tympanic membrane normal. No hemotympanum.  Left Ear: Tympanic membrane normal. No hemotympanum.  Nose: Nose normal. No sinus tenderness, nasal deformity, septal deviation or nasal septal hematoma. No epistaxis.  Mouth/Throat: Uvula is midline, oropharynx is clear and moist and mucous membranes are normal.  Eyes: Conjunctivae and EOM are normal. Pupils are equal, round, and reactive to light. Right eye exhibits no discharge. Left eye exhibits no discharge. No scleral icterus.  Neck: Normal range of motion. Neck supple.  Cardiovascular: Normal rate, regular rhythm, normal heart sounds and intact distal pulses.   Left DP pulse 2+  Pulmonary/Chest: Effort normal and breath sounds normal. No respiratory distress. He has no wheezes. He has no rales. He exhibits no tenderness.  Abdominal: Soft. Bowel sounds are normal. He exhibits no distension and no mass. There is no tenderness. There is no rebound and no guarding.  Musculoskeletal: He exhibits tenderness. He exhibits no edema or deformity.       Left foot: There is decreased range of motion, tenderness and swelling. There is normal capillary refill, no crepitus, no deformity and no laceration.  Diffuse tenderness to palpation and swelling of left foot, worse over heel. No abrasion or laceration present. no obvious deformity. Mild tenderness to palpation over left ankle and patella. Full ROM of left knee. Decreased ROM of left ankle and foot due to pain. Compartments soft. No midline spine tenderness.  Neurological: He is alert and oriented to person, place, and time. No cranial nerve deficit or sensory deficit. Coordination normal.  Sensations grossly intact.  Skin: Skin is warm and dry.  Nursing note and vitals reviewed.    ED Treatments / Results  DIAGNOSTIC STUDIES: Oxygen Saturation is 100% on RA,  normal by my interpretation.   COORDINATION OF CARE: 1:10 PM- Informed pt of fractured shown on X-Ray. Will consult orthopedics. Pt verbalizes understanding and agrees to plan.  3:46 PM- Pt reports his pain is 5/10. Offered additional pain medication but pt declined.    Medications  oxyCODONE-acetaminophen (PERCOCET/ROXICET) 5-325 MG per tablet 1 tablet (1 tablet Oral Given 12/17/16 1312)  ondansetron (ZOFRAN-ODT) disintegrating tablet 4 mg (4 mg Oral Given 12/17/16 1313)  morphine 4 MG/ML injection 4 mg (4 mg Intravenous Given 12/17/16 1431)  morphine 4 MG/ML injection 4 mg (4 mg Intravenous Given 12/17/16 1643)    Labs (all labs ordered are listed, but only abnormal results are displayed) Labs Reviewed  CBC WITH DIFFERENTIAL/PLATELET - Abnormal; Notable for the following:       Result Value   WBC 11.8 (*)    Neutro Abs 8.9 (*)    All other components within normal limits  BASIC METABOLIC PANEL    EKG  EKG Interpretation None       Radiology Dg Lumbar Spine Complete  Result Date: 12/17/2016 CLINICAL DATA:  Low back pain after falling from a ladder. EXAM: LUMBAR SPINE - COMPLETE 4+ VIEW COMPARISON:  Abdomen and pelvis CT dated 10/20/2015. FINDINGS: Five non-rib-bearing lumbar vertebrae. Mild anterior and lateral spur formation at multiple levels. No fractures, pars defects or subluxations. IMPRESSION: No fracture or subluxation.  Mild degenerative changes. Electronically Signed   By: Beckie Salts M.D.   On: 12/17/2016 15:26   Dg Tibia/fibula Left  Result Date: 12/17/2016 CLINICAL DATA:  Left lower leg pain following a fall from a ladder. EXAM: LEFT TIBIA AND FIBULA - 2 VIEW COMPARISON:  Left knee and ankle radiographs obtained at the same time. FINDINGS: Lateral soft tissue swelling at the level of the ankle. No fracture or dislocation seen. IMPRESSION: No fracture. Electronically Signed   By: Beckie Salts M.D.   On: 12/17/2016 15:25   Dg Ankle Complete Left  Result Date:  12/17/2016 CLINICAL DATA:  Left ankle and heel pain due to falling off a ladder today. Initial encounter. EXAM: LEFT ANKLE COMPLETE - 3+ VIEW COMPARISON:  None. FINDINGS: The patient has a comminuted fracture of the calcaneus which disrupts the posterior facet of the subtalar joint and likely the articulation of the calcaneus with the cuboid. No other acute bony or joint abnormality is seen. IMPRESSION: Comminuted calcaneus fracture as above. Electronically Signed   By: Drusilla Kanner M.D.   On: 12/17/2016 12:16   Dg Knee Complete 4 Views Left  Result Date: 12/17/2016 CLINICAL DATA:  Left knee pain after falling from a ladder. EXAM: LEFT KNEE - COMPLETE 4+ VIEW COMPARISON:  Left lower leg obtained today. FINDINGS: Mild to moderate medial spur formation and mild lateral spur formation. Minimal posterior patellar spur formation. No fracture or dislocation seen. A small effusion is noted. IMPRESSION: No fracture.  Mild degenerative changes and small effusion. Electronically Signed   By: Beckie Salts M.D.   On: 12/17/2016 15:23   Dg Foot Complete Left  Result Date: 12/17/2016 CLINICAL DATA:  Left foot pain following a fall from a ladder. EXAM: LEFT FOOT - COMPLETE 3+ VIEW COMPARISON:  Left ankle radiographs obtained at the same time. FINDINGS: Comminuted calcaneus fracture there is also a suggestion of a talus fracture on these views, not seen on the ankle views. No other fractures are seen. IMPRESSION: Comminuted calcaneus fracture and possible talus fracture. Electronically Signed   By: Beckie Salts M.D.   On: 12/17/2016 15:28    Procedures Procedures (including critical care time)  Medications Ordered in ED Medications  oxyCODONE-acetaminophen (PERCOCET/ROXICET) 5-325 MG per tablet 1 tablet (1 tablet Oral Given 12/17/16 1312)  ondansetron (ZOFRAN-ODT) disintegrating tablet 4 mg (4 mg Oral Given 12/17/16 1313)  morphine 4 MG/ML injection 4 mg (4 mg Intravenous Given 12/17/16 1431)  morphine 4 MG/ML  injection 4 mg (4 mg Intravenous Given 12/17/16 1643)     Initial Impression / Assessment and Plan / ED Course  I have reviewed the triage vital signs and the nursing notes.  Pertinent labs & imaging results that were available during my care of the patient were reviewed by me and considered in my medical decision making (see chart for details).  Clinical Course     Patient presents status post fall from 10 foot ladder which resulted in him landing on his left foot. Denies head injury or LOC. Denies use of anticoagulants. VSS. Diffuse tenderness to palpation and swelling of left foot, worse over heel. No abrasion or laceration present. no obvious deformity. Mild tenderness to palpation over left ankle and patella. Bilateral upper and lower extremities neurovascularly intact. No evidence of head injury. No midline spinal  tenderness. Patient given pain meds. Left foot x-ray revealed comminuted calcaneus fracture with evidence also suggesting talus fracture. Remaining imaging unremarkable. Consult to orthopedics. Dr. Valentina Gu advised to order CT scan for further imaging of calcaneal fracture in addition to placing patient in a well-padded posterior splint. Advised for patient to remain nonweightbearing and discharged home with pain meds. Plan to have patient call his office Monday morning to schedule a follow-up appointment for reevaluation this week. Discussed results and plan for discharge and follow up with patient.  Final Clinical Impressions(s) / ED Diagnoses   Final diagnoses:  Fall  Closed nondisplaced fracture of left calcaneus, unspecified portion of calcaneus, initial encounter    New Prescriptions New Prescriptions   IBUPROFEN (ADVIL,MOTRIN) 600 MG TABLET    Take 1 tablet (600 mg total) by mouth every 6 (six) hours as needed.   OXYCODONE-ACETAMINOPHEN (PERCOCET/ROXICET) 5-325 MG TABLET    Take 2 tablets by mouth every 4 (four) hours as needed for severe pain.     Satira Sark  [place], [place] 12/17/16 1718    Shaune Pollack, MD 12/18/16 1204

## 2016-12-17 NOTE — ED Notes (Signed)
Pt coming from xray 

## 2019-08-29 ENCOUNTER — Emergency Department (HOSPITAL_COMMUNITY): Payer: Self-pay

## 2019-08-29 ENCOUNTER — Observation Stay (HOSPITAL_COMMUNITY)
Admission: EM | Admit: 2019-08-29 | Discharge: 2019-08-30 | Disposition: A | Discharge status: Home / Self Care | Payer: Self-pay | Attending: Internal Medicine | Admitting: Internal Medicine

## 2019-08-29 ENCOUNTER — Encounter (HOSPITAL_COMMUNITY): Payer: Self-pay | Admitting: Neurology

## 2019-08-29 DIAGNOSIS — Z0184 Encounter for antibody response examination: Secondary | ICD-10-CM | POA: Insufficient documentation

## 2019-08-29 DIAGNOSIS — Z79899 Other long term (current) drug therapy: Secondary | ICD-10-CM | POA: Insufficient documentation

## 2019-08-29 DIAGNOSIS — Z20828 Contact with and (suspected) exposure to other viral communicable diseases: Secondary | ICD-10-CM | POA: Insufficient documentation

## 2019-08-29 DIAGNOSIS — F329 Major depressive disorder, single episode, unspecified: Secondary | ICD-10-CM

## 2019-08-29 DIAGNOSIS — Z7982 Long term (current) use of aspirin: Secondary | ICD-10-CM | POA: Insufficient documentation

## 2019-08-29 DIAGNOSIS — F1010 Alcohol abuse, uncomplicated: Secondary | ICD-10-CM

## 2019-08-29 DIAGNOSIS — F17210 Nicotine dependence, cigarettes, uncomplicated: Secondary | ICD-10-CM | POA: Insufficient documentation

## 2019-08-29 DIAGNOSIS — W11XXXA Fall on and from ladder, initial encounter: Secondary | ICD-10-CM | POA: Insufficient documentation

## 2019-08-29 DIAGNOSIS — S0181XA Laceration without foreign body of other part of head, initial encounter: Secondary | ICD-10-CM | POA: Insufficient documentation

## 2019-08-29 DIAGNOSIS — F1910 Other psychoactive substance abuse, uncomplicated: Secondary | ICD-10-CM | POA: Insufficient documentation

## 2019-08-29 DIAGNOSIS — R569 Unspecified convulsions: Principal | ICD-10-CM

## 2019-08-29 DIAGNOSIS — W19XXXA Unspecified fall, initial encounter: Secondary | ICD-10-CM

## 2019-08-29 DIAGNOSIS — S01512A Laceration without foreign body of oral cavity, initial encounter: Secondary | ICD-10-CM | POA: Insufficient documentation

## 2019-08-29 HISTORY — DX: Other psychoactive substance abuse, uncomplicated: F19.10

## 2019-08-29 HISTORY — DX: Alcohol abuse, uncomplicated: F10.10

## 2019-08-29 LAB — CBC WITH DIFFERENTIAL/PLATELET
Abs Immature Granulocytes: 0.05 10*3/uL (ref 0.00–0.07)
Basophils Absolute: 0 10*3/uL (ref 0.0–0.1)
Basophils Relative: 0 %
Eosinophils Absolute: 0.1 10*3/uL (ref 0.0–0.5)
Eosinophils Relative: 1 %
HCT: 45.8 % (ref 39.0–52.0)
Hemoglobin: 15.6 g/dL (ref 13.0–17.0)
Immature Granulocytes: 0 %
Lymphocytes Relative: 9 %
Lymphs Abs: 1.3 10*3/uL (ref 0.7–4.0)
MCH: 32.8 pg (ref 26.0–34.0)
MCHC: 34.1 g/dL (ref 30.0–36.0)
MCV: 96.2 fL (ref 80.0–100.0)
Monocytes Absolute: 0.9 10*3/uL (ref 0.1–1.0)
Monocytes Relative: 6 %
Neutro Abs: 11.8 10*3/uL — ABNORMAL HIGH (ref 1.7–7.7)
Neutrophils Relative %: 84 %
Platelets: 322 10*3/uL (ref 150–400)
RBC: 4.76 MIL/uL (ref 4.22–5.81)
RDW: 11.9 % (ref 11.5–15.5)
WBC: 14.2 10*3/uL — ABNORMAL HIGH (ref 4.0–10.5)
nRBC: 0 % (ref 0.0–0.2)

## 2019-08-29 LAB — CBC
HCT: 41.2 % (ref 39.0–52.0)
Hemoglobin: 13.6 g/dL (ref 13.0–17.0)
MCH: 32.1 pg (ref 26.0–34.0)
MCHC: 33 g/dL (ref 30.0–36.0)
MCV: 97.2 fL (ref 80.0–100.0)
Platelets: 296 10*3/uL (ref 150–400)
RBC: 4.24 MIL/uL (ref 4.22–5.81)
RDW: 12.1 % (ref 11.5–15.5)
WBC: 10.5 10*3/uL (ref 4.0–10.5)
nRBC: 0 % (ref 0.0–0.2)

## 2019-08-29 LAB — COMPREHENSIVE METABOLIC PANEL
ALT: 19 U/L (ref 0–44)
AST: 26 U/L (ref 15–41)
Albumin: 4 g/dL (ref 3.5–5.0)
Alkaline Phosphatase: 59 U/L (ref 38–126)
Anion gap: 10 (ref 5–15)
BUN: 11 mg/dL (ref 6–20)
CO2: 21 mmol/L — ABNORMAL LOW (ref 22–32)
Calcium: 9.4 mg/dL (ref 8.9–10.3)
Chloride: 104 mmol/L (ref 98–111)
Creatinine, Ser: 1.09 mg/dL (ref 0.61–1.24)
GFR calc Af Amer: >60 mL/min (ref >60)
GFR calc non Af Amer: >60 mL/min (ref >60)
Glucose, Bld: 106 mg/dL — ABNORMAL HIGH (ref 70–99)
Potassium: 4.4 mmol/L (ref 3.5–5.1)
Sodium: 135 mmol/L (ref 135–145)
Total Bilirubin: 0.6 mg/dL (ref 0.3–1.2)
Total Protein: 7 g/dL (ref 6.5–8.1)

## 2019-08-29 LAB — CBG MONITORING, ED: Glucose-Capillary: 135 mg/dL — ABNORMAL HIGH (ref 70–99)

## 2019-08-29 LAB — PHENYTOIN LEVEL, TOTAL: Phenytoin Lvl: <2.5 ug/mL — ABNORMAL LOW (ref 10.0–20.0)

## 2019-08-29 LAB — CK: Total CK: 170 U/L (ref 49–397)

## 2019-08-29 LAB — MAGNESIUM: Magnesium: 2 mg/dL (ref 1.7–2.4)

## 2019-08-29 LAB — ETHANOL: Alcohol, Ethyl (B): <10 mg/dL (ref <10)

## 2019-08-29 LAB — VALPROIC ACID LEVEL: Valproic Acid Lvl: <10 ug/mL — ABNORMAL LOW (ref 50.0–100.0)

## 2019-08-29 LAB — PHOSPHORUS: Phosphorus: 2 mg/dL — ABNORMAL LOW (ref 2.5–4.6)

## 2019-08-29 LAB — SARS CORONAVIRUS 2 (TAT 6-24 HRS): SARS Coronavirus 2: NEGATIVE

## 2019-08-29 MED ORDER — ADULT MULTIVITAMIN W/MINERALS CH
1.0000 | ORAL_TABLET | Freq: Every day | ORAL | Status: DC
  Administered 2019-08-29 – 2019-08-30 (×2): 1 via ORAL
  Filled 2019-08-29 (×2): qty 1

## 2019-08-29 MED ORDER — VITAMIN B-1 100 MG PO TABS
100.0000 mg | ORAL_TABLET | Freq: Every day | ORAL | Status: DC
  Administered 2019-08-29 – 2019-08-30 (×2): 100 mg via ORAL
  Filled 2019-08-29 (×2): qty 1

## 2019-08-29 MED ORDER — LORAZEPAM 2 MG/ML IJ SOLN
1.0000 mg | INTRAMUSCULAR | Status: DC | PRN
  Administered 2019-08-29: 20:00:00 2 mg via INTRAVENOUS
  Filled 2019-08-29: qty 1

## 2019-08-29 MED ORDER — HYDROCODONE-ACETAMINOPHEN 5-325 MG PO TABS
1.0000 | ORAL_TABLET | Freq: Four times a day (QID) | ORAL | Status: DC | PRN
  Administered 2019-08-29: 2 via ORAL
  Administered 2019-08-30: 1 via ORAL
  Filled 2019-08-29: qty 2
  Filled 2019-08-29: qty 1

## 2019-08-29 MED ORDER — SODIUM CHLORIDE 0.9 % IV SOLN
INTRAVENOUS | Status: DC
  Administered 2019-08-29 – 2019-08-30 (×3): via INTRAVENOUS

## 2019-08-29 MED ORDER — LORAZEPAM 1 MG PO TABS: 0.0000 mg | ORAL_TABLET | Freq: Four times a day (QID) | ORAL | Status: DC

## 2019-08-29 MED ORDER — MORPHINE SULFATE (PF) 4 MG/ML IV SOLN
4.0000 mg | Freq: Once | INTRAVENOUS | Status: AC
  Administered 2019-08-29: 4 mg via INTRAVENOUS
  Filled 2019-08-29: qty 1

## 2019-08-29 MED ORDER — LIDOCAINE HCL (PF) 1 % IJ SOLN
10.0000 mL | Freq: Once | INTRAMUSCULAR | Status: DC
  Filled 2019-08-29: qty 10

## 2019-08-29 MED ORDER — LORAZEPAM 1 MG PO TABS: 0.0000 mg | ORAL_TABLET | Freq: Two times a day (BID) | ORAL | Status: DC

## 2019-08-29 MED ORDER — ACETAMINOPHEN 325 MG PO TABS
650.0000 mg | ORAL_TABLET | Freq: Once | ORAL | Status: AC
Start: 2019-08-29 — End: 2019-08-29
  Administered 2019-08-29: 650 mg via ORAL
  Filled 2019-08-29: qty 2

## 2019-08-29 MED ORDER — LORAZEPAM 1 MG PO TABS: 1.0000 mg | ORAL_TABLET | ORAL | Status: DC | PRN

## 2019-08-29 MED ORDER — FOLIC ACID 1 MG PO TABS
1.0000 mg | ORAL_TABLET | Freq: Every day | ORAL | Status: DC
  Administered 2019-08-29 – 2019-08-30 (×2): 1 mg via ORAL
  Filled 2019-08-29 (×2): qty 1

## 2019-08-29 NOTE — ED Notes (Signed)
sz pads placed on bed. Suction at bedside.
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
## 2019-08-29 NOTE — H&P (Signed)
History and Physical:    Matthew Bryant   JAS:[id number] DOB: [birth year] DOA: 08/29/2019  Referring MD/provider: Dr. Tomi Bamberger PCP: Patient, No Pcp Per   Patient coming from: Home  Chief Complaint: Seizure  History of Present Illness:   Matthew Bryant is an 53 y.o. male who has no known past medical history however admits to never going to see physicians was in his usual state of health until earlier today when he fell off a ladder secondary to seizure.  History as per patient, his mother as well as review of notes in the chart.  Patient apparently was on a ladder 12 to 15 feet off the ground when he was found on the floor and with a tonic-clonic seizure.  Apparently patient was postictal when he came to.  Patient himself denies any known history of seizures.  He does however admit to feeling quite depressed over the past 6 months to a year due to loss of father, stepfather as well as difficulties with his jobs.  Patient admits to increased alcohol use as well as ongoing marijuana use however he stopped both marijuana and alcohol 3 to 4 days ago.  Patient states he only drinks 4 beers a day however this is different from what he is told other providers.  He admits that he has been drinking more as a way to "take the edge off".  Patient does admit to depressed mood and increased irritability.  He has no suicidal ideation or plans.  He notes he does not have a PCP or psychiatrist and he did take 1 of his wife's antidepressants however "it knocked me out and I could not function".   Review of systems is notable for a new cough that has occurred since he arrived in the ER.  Patient states he did not have a cough before.  Denies fevers.  Denies any shortness of breath at present.  He notes he does have aching in his muscles of his legs and back, this is also been onset since he is arrived in the ED.  ED Course:  The patient was noted to have lacerations and ecchymoses on his face.  He also had a  tongue laceration.  Multiple x-rays ruled out any fractures.  Patient was seen by neurology who recommended admission for MRI and EEG.  ROS:   ROS   Review of Systems: General: No fever, chills, weight changes Skin: No rashes, lesions, wounds Endocrine: no heat/cold intolerance, no polyuria Respiratory: No shortness of breath, hemoptysis Cardiovascular: No palpitations, chest pain GI: No nausea, vomiting, diarrhea, constipation GU: No dysuria, increased frequency CNS: No numbness, dizziness, headache Musculoskeletal: No back pain, joint pain Blood/lymphatics: No easy bruising, bleeding Mood/affect: No anxiety/depression    Past Medical History:   Past Medical History:  Diagnosis Date  . Infected blister of right thigh 12/02/2015  . Neck pain     Past Surgical History:   Past Surgical History:  Procedure Laterality Date  . APPENDECTOMY    . I&D EXTREMITY Right 12/03/2015   Procedure: IRRIGATION AND DEBRIDEMENT RIGHT LEG;  Surgeon: Marchia Bond, MD;  Location: [place];  Service: Orthopedics;  Laterality: Right;    Social History:   Social History   Socioeconomic History  . Marital status: Single    Spouse name: Not on file  . Number of children: Not on file  . Years of education: Not on file  . Highest education level: Not on file  Occupational History  . Not on file  Social Needs  . Financial resource strain: Not on file  . Food insecurity    Worry: Not on file    Inability: Not on file  . Transportation needs    Medical: Not on file    Non-medical: Not on file  Tobacco Use  . Smoking status: Light Tobacco Smoker    Types: Cigarettes  Substance and Sexual Activity  . Alcohol use: Yes  . Drug use: Yes    Types: Marijuana  . Sexual activity: Not on file  Lifestyle  . Physical activity    Days per week: Not on file    Minutes per session: Not on file  . Stress: Not on file  Relationships  . Social Musician on phone: Not on file    Gets  together: Not on file    Attends religious service: Not on file    Active member of club or organization: Not on file    Attends meetings of clubs or organizations: Not on file    Relationship status: Not on file  . Intimate partner violence    Fear of current or ex partner: Not on file    Emotionally abused: Not on file    Physically abused: Not on file    Forced sexual activity: Not on file  Other Topics Concern  . Not on file  Social History Narrative  . Not on file    Allergies   Patient has no known allergies.  Family history:   No family history on file.  Current Medications:   Prior to Admission medications   Medication Sig Start Date End Date Taking? Authorizing Provider  Aspirin-Caffeine (BC FAST PAIN RELIEF ARTHRITIS) 1000-65 MG PACK Take 1 Package by mouth 2 (two) times daily.   Yes [provider]  b complex vitamins capsule Take 1 capsule by mouth daily.   Yes [provider]  LYSINE PO Take 1 tablet by mouth daily.   Yes [provider]  Multiple Vitamin (MULTIVITAMIN WITH MINERALS) TABS tablet Take 1 tablet by mouth daily.   Yes [provider]  vitamin C (ASCORBIC ACID) 500 MG tablet Take 500 mg by mouth daily.   Yes [provider]  doxycycline (VIBRAMYCIN) 50 MG capsule Take 1 capsule (50 mg total) by mouth 2 (two) times daily. Patient not taking: Reported on 12/17/2016 12/03/15   Teryl Lucy, MD  ibuprofen (ADVIL,MOTRIN) 600 MG tablet Take 1 tablet (600 mg total) by mouth every 6 (six) hours as needed. Patient not taking: Reported on 08/29/2019 12/17/16   Barrett Henle, PA-C  oxyCODONE (ROXICODONE) 5 MG immediate release tablet Take 1 tablet (5 mg total) by mouth every 4 (four) hours as needed for severe pain. Patient not taking: Reported on 12/17/2016 12/03/15   Teryl Lucy, MD  oxyCODONE-acetaminophen (PERCOCET/ROXICET) 5-325 MG tablet Take 2 tablets by mouth every 4 (four) hours as needed for severe  pain. Patient not taking: Reported on 08/29/2019 12/17/16   Barrett Henle, PA-C  sulfamethoxazole-trimethoprim (BACTRIM) 400-80 MG tablet Take 1 tablet by mouth 2 (two) times daily. Patient not taking: Reported on 12/17/2016 12/03/15   Teryl Lucy, MD  promethazine (PHENERGAN) 25 MG tablet Take 1 tablet (25 mg total) by mouth every 6 (six) hours as needed for nausea or vomiting. Patient not taking: Reported on 02/20/2015 11/23/13 10/20/15  Ward, Layla Maw, DO    Physical Exam:   Vitals:   08/29/19 1216 08/29/19 1605  BP: (!) 124/91 (!) 151/94  Pulse: (!) 53 (!) 50  Resp: (!) 21 17  Temp: 98.3 F (36.8 C)   SpO2: 95% 98%     Physical Exam: Blood pressure (!) 151/94, pulse (!) 50, temperature 98.3 F (36.8 C), resp. rate 17, SpO2 98 %. Gen: Patient appearing older than stated age sitting up in stretcher with his attentive mother at bedside.  He has lacerations on his face has been cleaned although he does have some blood staining on his cheeks. Eyes: Sclerae anicteric. Conjunctiva mildly injected.  He does have some lacerations on his tongue on the edges. Chest: Moderately good air entry bilaterally with no adventitious sounds.  CV: Distant, regular, no audible murmurs. Abdomen: NABS, soft, nondistended, nontender. No tenderness to light or deep palpation. No rebound, no guarding. Extremities: No edema.  Skin: Warm and dry. No rashes, lesions or wounds. Neuro: Alert and oriented times 3; grossly nonfocal. Psych: Patient is cooperative, logical and coherent with depressed mood and affect.  Intermittently teary during the discussion.  Data Review:    Labs: Basic Metabolic Panel: Recent Labs  Lab 08/29/19 1133  NA 135  K 4.4  CL 104  CO2 21*  GLUCOSE 106*  BUN 11  CREATININE 1.09  CALCIUM 9.4   Liver Function Tests: Recent Labs  Lab 08/29/19 1133  AST 26  ALT 19  ALKPHOS 59  BILITOT 0.6  PROT 7.0  ALBUMIN 4.0   No results for input(s): LIPASE,  AMYLASE in the last 168 hours. No results for input(s): AMMONIA in the last 168 hours. CBC: Recent Labs  Lab 08/29/19 1133  WBC 14.2*  NEUTROABS 11.8*  HGB 15.6  HCT 45.8  MCV 96.2  PLT 322   Cardiac Enzymes: No results for input(s): CKTOTAL, CKMB, CKMBINDEX, TROPONINI in the last 168 hours.  BNP (last 3 results) No results for input(s): PROBNP in the last 8760 hours. CBG: Recent Labs  Lab 08/29/19 1221  GLUCAP 135*    Urinalysis    Component Value Date/Time   COLORURINE YELLOW 10/20/2015 1600   APPEARANCEUR CLEAR 10/20/2015 1600   LABSPEC 1.005 10/20/2015 1600   PHURINE 6.0 10/20/2015 1600   GLUCOSEU NEGATIVE 10/20/2015 1600   HGBUR NEGATIVE 10/20/2015 1600   BILIRUBINUR NEGATIVE 10/20/2015 1600   KETONESUR NEGATIVE 10/20/2015 1600   PROTEINUR NEGATIVE 10/20/2015 1600   UROBILINOGEN 0.2 10/20/2015 1600   NITRITE NEGATIVE 10/20/2015 1600   LEUKOCYTESUR NEGATIVE 10/20/2015 1600      Radiographic Studies: Ct Head Wo Contrast  Result Date: 08/29/2019 CLINICAL DATA:  Seizure, no history of trauma. EXAM: CT HEAD WITHOUT CONTRAST TECHNIQUE: Contiguous axial images were obtained from the base of the skull through the vertex without intravenous contrast. COMPARISON:  12/12/2008 FINDINGS: Brain: No evidence of acute infarction, hemorrhage, hydrocephalus, extra-axial collection or mass lesion/mass effect. Vascular: No hyperdense vessel or unexpected calcification. Skull: No signs of fracture or focal lesion. Sinuses/Orbits: Mild ethmoid sinus disease and some frothy secretions in the right maxillary sinus. Other: None. IMPRESSION: No acute intracranial finding. Minimal sinus disease similar to study of 20/10 Electronically Signed   By: Donzetta KohutGeoffrey  Wile M.D.   On: 08/29/2019 15:05   Ct Cervical Spine Wo Contrast  Result Date: 08/29/2019 CLINICAL DATA:  Patient status post seizure today. Initial encounter. EXAM: CT CERVICAL SPINE WITHOUT CONTRAST TECHNIQUE: Multidetector CT  imaging of the cervical spine was performed without intravenous contrast. Multiplanar CT image reconstructions were also generated. COMPARISON:  None. FINDINGS: Alignment: Trace retrolisthesis C5 on C6 noted. Skull base and vertebrae: No  acute fracture. No primary bone lesion or focal pathologic process. Soft tissues and spinal canal: No prevertebral fluid or swelling. No visible canal hematoma. Disc levels:  Mild loss of disc space height at C5-6 and C6-7 noted. Upper chest: Lung apices clear. Other: None. IMPRESSION: No acute abnormality. Mild degenerative disc disease C5-6 and C6-7. Electronically Signed   By: Drusilla Kanner M.D.   On: 08/29/2019 15:06    EKG: Independently reviewed.  Ordered and pending   Assessment/Plan:   Principal Problem:   New onset seizure (HCC)  NEW ONSET SEIZURE 53 year old with first seizure today, very possibly secondary to cessation of alcohol. Will admit for observation and to complete seizure work-up. Patient is being followed by neurology.  ALCOHOL USE Patient is not really giving a clear answer as to how much alcohol and marijuana he uses. We will place patient on CIWA protocol with p.o. Lorazepam as needed. At present there is no evidence for increased hyperadrenergic state.  DEPRESSED MOOD  Patient clearly has a depressed mood and probably meets criteria for depression. He would benefit from referral to mental health services as an outpatient. Social work consult placed  FALL FROM LADDER Patient has significant fall from a ladder, he will likely have muscle spasms over the next couple of days. No CPK was done in ED, will order CPK now and in the morning. We will also hydrate patient overnight in case he does have elevation in CPK to avoid renal dysfunction. Norco 1-2 tabs every 6 hours PRN ordered He also has the p.o. Lorazepam for the CIWA protocol which might be helpful as well.     Other information:   DVT prophylaxis: No DVT  prophylaxis ordered as patient is outpatient and ambulatory Code Status: Full code. Family Communication: Mother was at bedside throughout Disposition Plan: Home Consults called: Neurology per ED Admission status: Observation  The medical decision making is of moderate complexity, therefore this is a level 2 visit.  Horatio Pel Tublu Laruen Risser [facility]  If 7PM-7AM, please contact night-coverage [email or web address] Password [facility] 08/29/2019, 5:05 PM

## 2019-08-29 NOTE — ED Triage Notes (Signed)
C/C: Seizures. 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 Pt was holding ladder for coworker when seizure occurred. Pt has no hx of such. Multiple superficial lacs to face. Tongue trauma noted. Bleeding controlled PTA. Pt is alert and oriented x4 at time of triage. EMS reported postictal state on scene.

## 2019-08-29 NOTE — ED Notes (Signed)
Pt to CT

## 2019-08-29 NOTE — ED Provider Notes (Signed)
[facility] EMERGENCY DEPARTMENT Provider Note   CSN: [id number] Arrival date & time: 08/29/19  1106     History   Chief Complaint seizure  HPI Yeray Boldon is a 53 y.o. male.     HPI Pt was outside working,. The next thing he remembers is being in the ambulance.  He denies any prodrome.  Co workers saw him fall to he ground and he appeared to be having a seizure.   EMS found him confused and he had bitten his tongue.  No history of seizures.  No new medications.   Pt drinks alcohol,1- 2 beers per day.  None in the last 3 days.  Denies any history of alcohol withdrawal.  Pt states he did have a some spots noted on a ct scan in the past.   He did not follow up with anyone since then.   Past Medical History:  Diagnosis Date  . Infected blister of right thigh 12/02/2015  . Neck pain     Patient Active Problem List   Diagnosis Date Noted  . Abscess of right thigh 12/03/2015  . Cellulitis 12/02/2015  . Infected blister of right thigh 12/02/2015  . Blister of thigh with infection 12/02/2015    Past Surgical History:  Procedure Laterality Date  . APPENDECTOMY    . I&D EXTREMITY Right 12/03/2015   Procedure: IRRIGATION AND DEBRIDEMENT RIGHT LEG;  Surgeon: Teryl Lucy, MD;  Location: [facility] OR;  Service: Orthopedics;  Laterality: Right;        Home Medications    Prior to Admission medications   Medication Sig Start Date End Date Taking? Authorizing Provider  Aspirin-Caffeine (BC FAST PAIN RELIEF ARTHRITIS) 1000-65 MG PACK Take 1 Package by mouth 2 (two) times daily.   Yes [provider]  b complex vitamins capsule Take 1 capsule by mouth daily.   Yes [provider]  LYSINE PO Take 1 tablet by mouth daily.   Yes [provider]  Multiple Vitamin (MULTIVITAMIN WITH MINERALS) TABS tablet Take 1 tablet by mouth daily.   Yes [provider]  vitamin C (ASCORBIC ACID) 500 MG tablet Take 500 mg by mouth daily.   Yes  [provider]  doxycycline (VIBRAMYCIN) 50 MG capsule Take 1 capsule (50 mg total) by mouth 2 (two) times daily. Patient not taking: Reported on 12/17/2016 12/03/15   Teryl Lucy, MD  ibuprofen (ADVIL,MOTRIN) 600 MG tablet Take 1 tablet (600 mg total) by mouth every 6 (six) hours as needed. Patient not taking: Reported on 08/29/2019 12/17/16   Barrett Henle, PA-C  oxyCODONE (ROXICODONE) 5 MG immediate release tablet Take 1 tablet (5 mg total) by mouth every 4 (four) hours as needed for severe pain. Patient not taking: Reported on 12/17/2016 12/03/15   Teryl Lucy, MD  oxyCODONE-acetaminophen (PERCOCET/ROXICET) 5-325 MG tablet Take 2 tablets by mouth every 4 (four) hours as needed for severe pain. Patient not taking: Reported on 08/29/2019 12/17/16   Barrett Henle, PA-C  sulfamethoxazole-trimethoprim (BACTRIM) 400-80 MG tablet Take 1 tablet by mouth 2 (two) times daily. Patient not taking: Reported on 12/17/2016 12/03/15   Teryl Lucy, MD  promethazine (PHENERGAN) 25 MG tablet Take 1 tablet (25 mg total) by mouth every 6 (six) hours as needed for nausea or vomiting. Patient not taking: Reported on 02/20/2015 11/23/13 10/20/15  Ward, Layla Maw, DO    Family History No family history on file.  Social History Social History   Tobacco Use  . Smoking status:  Light Tobacco Smoker    Types: Cigarettes  Substance Use Topics  . Alcohol use: Yes  . Drug use: Yes    Types: Marijuana     Allergies   Patient has no known allergies.   Review of Systems Review of Systems  All other systems reviewed and are negative.    Physical Exam Updated Vital Signs BP (!) 151/94   Pulse (!) 50   Temp 98.3 F (36.8 C)   Resp 17   SpO2 98%   Physical Exam Vitals signs and nursing note reviewed.  Constitutional:      General: He is not in acute distress.    Appearance: He is well-developed.  HENT:     Head: Normocephalic and atraumatic.     Right Ear:  External ear normal.     Left Ear: External ear normal.     Mouth/Throat:      Comments: Laceration at the distal third of the tongue, small amount of wound gaping Eyes:     General: No scleral icterus.       Right eye: No discharge.        Left eye: No discharge.     Conjunctiva/sclera: Conjunctivae normal.  Neck:     Musculoskeletal: Neck supple.     Trachea: No tracheal deviation.  Cardiovascular:     Rate and Rhythm: Normal rate and regular rhythm.  Pulmonary:     Effort: Pulmonary effort is normal. No respiratory distress.     Breath sounds: Normal breath sounds. No stridor. No wheezing or rales.  Abdominal:     General: Bowel sounds are normal. There is no distension.     Palpations: Abdomen is soft.     Tenderness: There is no abdominal tenderness. There is no guarding or rebound.  Musculoskeletal:     Cervical back: He exhibits tenderness.     Thoracic back: Normal.     Lumbar back: Normal.  Skin:    General: Skin is warm and dry.     Findings: No rash.  Neurological:     Mental Status: He is alert.     Cranial Nerves: No cranial nerve deficit (no facial droop, extraocular movements intact, no slurred speech).     Sensory: No sensory deficit.     Motor: No abnormal muscle tone or seizure activity.     Coordination: Coordination normal.      ED Treatments / Results  Labs (all labs ordered are listed, but only abnormal results are displayed) Labs Reviewed  COMPREHENSIVE METABOLIC PANEL - Abnormal; Notable for the following components:      Result Value   CO2 21 (*)    Glucose, Bld 106 (*)    All other components within normal limits  PHENYTOIN LEVEL, TOTAL - Abnormal; Notable for the following components:   Phenytoin Lvl <2.5 (*)    All other components within normal limits  VALPROIC ACID LEVEL - Abnormal; Notable for the following components:   Valproic Acid Lvl <10 (*)    All other components within normal limits  CBC WITH DIFFERENTIAL/PLATELET -  Abnormal; Notable for the following components:   WBC 14.2 (*)    Neutro Abs 11.8 (*)    All other components within normal limits  CBG MONITORING, ED - Abnormal; Notable for the following components:   Glucose-Capillary 135 (*)    All other components within normal limits  SARS CORONAVIRUS 2 (TAT 6-24 HRS)  ETHANOL    EKG None  Radiology Ct Head Wo Contrast  Result Date: 08/29/2019 CLINICAL DATA:  Seizure, no history of trauma. EXAM: CT HEAD WITHOUT CONTRAST TECHNIQUE: Contiguous axial images were obtained from the base of the skull through the vertex without intravenous contrast. COMPARISON:  12/12/2008 FINDINGS: Brain: No evidence of acute infarction, hemorrhage, hydrocephalus, extra-axial collection or mass lesion/mass effect. Vascular: No hyperdense vessel or unexpected calcification. Skull: No signs of fracture or focal lesion. Sinuses/Orbits: Mild ethmoid sinus disease and some frothy secretions in the right maxillary sinus. Other: None. IMPRESSION: No acute intracranial finding. Minimal sinus disease similar to study of 20/10 Electronically Signed   By: Donzetta Kohut M.D.   On: 08/29/2019 15:05   Ct Cervical Spine Wo Contrast  Result Date: 08/29/2019 CLINICAL DATA:  Patient status post seizure today. Initial encounter. EXAM: CT CERVICAL SPINE WITHOUT CONTRAST TECHNIQUE: Multidetector CT imaging of the cervical spine was performed without intravenous contrast. Multiplanar CT image reconstructions were also generated. COMPARISON:  None. FINDINGS: Alignment: Trace retrolisthesis C5 on C6 noted. Skull base and vertebrae: No acute fracture. No primary bone lesion or focal pathologic process. Soft tissues and spinal canal: No prevertebral fluid or swelling. No visible canal hematoma. Disc levels:  Mild loss of disc space height at C5-6 and C6-7 noted. Upper chest: Lung apices clear. Other: None. IMPRESSION: No acute abnormality. Mild degenerative disc disease C5-6 and C6-7. Electronically  Signed   By: Drusilla Kanner M.D.   On: 08/29/2019 15:06    Procedures .Marland KitchenLaceration Repair  Date/Time: 08/29/2019 3:56 PM Performed by: Linwood Dibbles, MD Authorized by: Linwood Dibbles, MD   Consent:    Consent obtained:  Verbal   Consent given by:  Patient   Risks discussed:  Infection, need for additional repair, pain, poor cosmetic result and poor wound healing   Alternatives discussed:  No treatment and delayed treatment Universal protocol:    Procedure explained and questions answered to patient or proxy's satisfaction: yes     Relevant documents present and verified: yes     Test results available and properly labeled: yes     Imaging studies available: yes     Required blood products, implants, devices, and special equipment available: yes     Site/side marked: yes     Immediately prior to procedure, a time out was called: yes     Patient identity confirmed:  Verbally with patient Anesthesia (see MAR for exact dosages):    Anesthesia method:  Local infiltration   Local anesthetic:  Lidocaine 1% w/o epi Laceration details:    Location: tongue.   Length (cm):  2 Repair type:    Repair type:  Simple Exploration:    Wound exploration: entire depth of wound probed and visualized     Contaminated: no   Mucous membrane repair:    Suture size:  3-0   Suture material:  Chromic gut   Number of sutures:  3 Post-procedure details:    Dressing:  Open (no dressing)   (including critical care time)  Medications Ordered in ED Medications  lidocaine (PF) (XYLOCAINE) 1 % injection 10 mL (has no administration in time range)  acetaminophen (TYLENOL) tablet 650 mg (650 mg Oral Given 08/29/19 1340)  morphine 4 MG/ML injection 4 mg (4 mg Intravenous Given 08/29/19 1436)     Initial Impression / Assessment and Plan / ED Course  I have reviewed the triage vital signs and the nursing notes.  Pertinent labs & imaging results that were available during my care of the patient were reviewed by  me and considered  in my medical decision making (see chart for details).  Clinical Course as of Aug 28 1702  Wed Aug 29, 2019  1341 Valproic acid level and phenytoin level accidentally ordered by me. Discussed with Charge RN about crediting patient for this.   [JK]  1624 D/w Dr Otelia LimesLindzen.  Would recommend mri and EEG.   Admission is   [JK]    Clinical Course User Index [JK] Linwood DibblesKnapp, Lakeeta Dobosz, MD     Patient presents with new onset seizure.  Initial ED work-up shows unremarkable labs and no acute findings on head CT or C-spine CT.  Patient does admit to daily alcohol use but he does not appear to be having any signs of alcohol withdrawal here in the ED.  I am not certain if his seizure could be related to his alcohol use.  Discussed case with Dr. Otelia LimesLindzen neurology and plan on bringing the patient in for further work-up including MRI and EEG.  Findings and plan were discussed with the patient as well as wife.  They agree with admission.    Final Clinical Impressions(s) / ED Diagnoses   Final diagnoses:  Seizure (HCC)  Laceration of tongue, initial encounter      Linwood DibblesKnapp, Keyia Moretto, MD 08/29/19 1705

## 2019-08-29 NOTE — Progress Notes (Signed)
Consult request has been received. CSW attempting to follow up with pt regarding SA/[facility] outpatient treatment.   [facility]  Clinical Social Worker  Ph: [phone]

## 2019-08-30 ENCOUNTER — Observation Stay (HOSPITAL_COMMUNITY): Payer: Self-pay

## 2019-08-30 ENCOUNTER — Encounter (HOSPITAL_COMMUNITY): Payer: Self-pay | Admitting: *Deleted

## 2019-08-30 DIAGNOSIS — F1010 Alcohol abuse, uncomplicated: Secondary | ICD-10-CM | POA: Diagnosis present

## 2019-08-30 DIAGNOSIS — F1910 Other psychoactive substance abuse, uncomplicated: Secondary | ICD-10-CM | POA: Diagnosis present

## 2019-08-30 LAB — BASIC METABOLIC PANEL
Anion gap: 9 (ref 5–15)
BUN: 8 mg/dL (ref 6–20)
CO2: 22 mmol/L (ref 22–32)
Calcium: 8.5 mg/dL — ABNORMAL LOW (ref 8.9–10.3)
Chloride: 107 mmol/L (ref 98–111)
Creatinine, Ser: 1.1 mg/dL (ref 0.61–1.24)
GFR calc Af Amer: >60 mL/min (ref >60)
GFR calc non Af Amer: >60 mL/min (ref >60)
Glucose, Bld: 107 mg/dL — ABNORMAL HIGH (ref 70–99)
Potassium: 3.6 mmol/L (ref 3.5–5.1)
Sodium: 138 mmol/L (ref 135–145)

## 2019-08-30 LAB — CBC
HCT: 40.2 % (ref 39.0–52.0)
Hemoglobin: 13.8 g/dL (ref 13.0–17.0)
MCH: 33.1 pg (ref 26.0–34.0)
MCHC: 34.3 g/dL (ref 30.0–36.0)
MCV: 96.4 fL (ref 80.0–100.0)
Platelets: 276 10*3/uL (ref 150–400)
RBC: 4.17 MIL/uL — ABNORMAL LOW (ref 4.22–5.81)
RDW: 11.9 % (ref 11.5–15.5)
WBC: 9.6 10*3/uL (ref 4.0–10.5)
nRBC: 0 % (ref 0.0–0.2)

## 2019-08-30 LAB — HIV ANTIBODY (ROUTINE TESTING W REFLEX): HIV Screen 4th Generation wRfx: NONREACTIVE

## 2019-08-30 LAB — CK: Total CK: 179 U/L (ref 49–397)

## 2019-08-30 MED ORDER — THIAMINE HCL 100 MG PO TABS: 100.0000 mg | ORAL_TABLET | Freq: Every day | ORAL | Status: AC

## 2019-08-30 NOTE — Progress Notes (Signed)
Patient received to the unit via bed. Patient is alert and oriented x4. Vital signs are stable. Iv in place and running fluid. Skin assessment done with another nurse. No distress noted. Patient given medicine for pain. Oriented to room, use of call bell and phone. Patient is on tele. Bed in low position and call bell in reach.

## 2019-08-30 NOTE — Discharge Summary (Signed)
Physician Discharge Summary  Matthew Bryant ZHY:[id number] DOB: [birth year] DOA: 08/29/2019  PCP: Patient, No Pcp Per  Admit date: 08/29/2019 Discharge date: 08/30/2019  Time spent: 45 minutes  Recommendations for Outpatient Follow-up:   1. Once established at PCP-- referral to counseling and neurology for follow up    Discharge Diagnoses:  Principal Problem:   New onset seizure [facility]) Active Problems:   ETOH abuse   Substance abuse (HCC)   Discharge Condition: stable  Diet recommendation: regular  Filed Weights   08/29/19 2027  Weight: 83.1 kg    History of present illness:  Matthew Bryant is an 53 y.o. male who has no known past medical history however admits to never going to see physicians was in his usual state of health until 9/23 when he fell off a ladder secondary to seizure.  History as per patient, his mother as well as review of notes in the chart.  Patient apparently was on a ladder 12 to 15 feet off the ground when he was found on the ground with a tonic-clonic seizure.  Apparently patient was postictal when he came to.  Patient  denied any known history of seizures.  He does however admit to feeling quite depressed over the past 6 months to a year due to loss of father, stepfather as well as difficulties with his jobs.  Patient admited to increased alcohol use as well as ongoing marijuana use until 4 days prior when  he stopped both marijuana and alcohol.  Patient stated he  drinks 4 20oz beers a day however this is different from what he is told other providers.  He admited that he had been drinking more as a way to "take the edge off".  Patient does admit to depressed mood and increased irritability.  He has no suicidal ideation or plans.  He noted he does not have a PCP or psychiatrist and he did take 1 of his wife's antidepressants however "it knocked me out and I could not function".    Hospital Course:  NEW ONSET SEIZURE. 53 year old with first seizure  very  possibly secondary to cessation of alcohol. MRI brain with no acute finding. EEG with diffuse slowing CT head no acute finding. CT neck with mild degenerative disc.  Evaluated by neuro who opined likely related to cessation of  ETOH.   ALCOHOL USE Patient is not really giving a clear answer as to how much alcohol and marijuana he uses. No s/sx withdrawal. At time of discharge admitted to "3 or 4 20oz beers daily".   DEPRESSED MOOD Patient clearly has a depressed mood and probably meets criteria for depression. Denies SI.  He has no PCP. Care management for OP follow up and counseling   FALL FROM LADDER Patient had significant fall from a ladder, he will likely have muscle spasms over the next couple of days. CK within limits of normal x2.  Ambulated in hall   Procedures:    Consultations:  [facility] neurology  Discharge Exam: Vitals:   08/30/19 0656 08/30/19 0745  BP: 114/77 124/76  Pulse: (!) 53 (!) 53  Resp: 16   Temp: 98.6 F (37 C)   SpO2: 98%     General: awake alert eating breakfast no acute distress Cardiovascular: rrr no mgr no LE edema Respiratory: normal effort BS clear bilaterally Skin: multiple abrasions face  Discharge Instructions   Discharge Instructions    Diet - low sodium heart healthy   Complete by: As directed    Discharge instructions  Complete by: As directed    [facility]-- will ask them to refer you for counseling [place] [facility] [place] [place] statutes, patients with seizures are not allowed to drive until they have been seizure-free for six months.   Use caution when using heavy equipment or power tools. Avoid working on ladders or at heights. Take showers instead of baths. Ensure the water temperature is not too high on the home water heater. Do not go swimming alone. Do not lock yourself in a room alone (i.e. bathroom). When caring for infants or small children, sit down when holding, feeding, or changing them  to minimize risk of injury to the child in the event you have a seizure. Maintain good sleep hygiene. Avoid alcohol.   If patienthas another seizure, call 911 and bring them back to the ED if: A. The seizure lasts longer than 5 minutes.  B. The patient doesn't wake shortly after the seizure or has new problems such as difficulty seeing, speaking or moving following the seizure C. The patient was injured during the seizure D. The patient has a temperature over 102 F (39C) E. The patient vomited during the seizure and now is having trouble breathing  EEG showed: This study is suggestive of mild diffuse encephalopathy, nonspecific to etiology  This is most common in patients with underlying associated hepatic (from alcohol) or renal impairment or both, and resultant encephalopathy, although similar patterns may be induced by drug intoxication or adverse effects or other nonlesional causes of severe generalized cerebral dysfunction.   Increase activity slowly   Complete by: As directed      Allergies as of 08/30/2019   No Known Allergies     Medication List    STOP taking these medications   doxycycline 50 MG capsule Commonly known as: VIBRAMYCIN   ibuprofen 600 MG tablet Commonly known as: ADVIL   LYSINE PO   oxyCODONE 5 MG immediate release tablet Commonly known as: Roxicodone   oxyCODONE-acetaminophen 5-325 MG tablet Commonly known as: PERCOCET/ROXICET   sulfamethoxazole-trimethoprim 400-80 MG tablet Commonly known as: Bactrim     TAKE these medications   b complex vitamins capsule Take 1 capsule by mouth daily.   BC Fast Pain Relief Arthritis 1000-65 MG Pack Generic drug: Aspirin-Caffeine Take 1 Package by mouth 2 (two) times daily.   multivitamin with minerals Tabs tablet Take 1 tablet by mouth daily.   thiamine 100 MG tablet Take 1 tablet (100 mg total) by mouth daily. Start taking on: August 31, 2019   vitamin C 500 MG tablet Commonly known as:  ASCORBIC ACID Take 500 mg by mouth daily.      No Known Allergies    The results of significant diagnostics from this hospitalization (including imaging, microbiology, ancillary and laboratory) are listed below for reference.    Significant Diagnostic Studies: Ct Head Wo Contrast  Result Date: 08/29/2019 CLINICAL DATA:  Seizure, no history of trauma. EXAM: CT HEAD WITHOUT CONTRAST TECHNIQUE: Contiguous axial images were obtained from the base of the skull through the vertex without intravenous contrast. COMPARISON:  12/12/2008 FINDINGS: Brain: No evidence of acute infarction, hemorrhage, hydrocephalus, extra-axial collection or mass lesion/mass effect. Vascular: No hyperdense vessel or unexpected calcification. Skull: No signs of fracture or focal lesion. Sinuses/Orbits: Mild ethmoid sinus disease and some frothy secretions in the right maxillary sinus. Other: None. IMPRESSION: No acute intracranial finding. Minimal sinus disease similar to study of 20/10 Electronically Signed   By: Jason FilaGeoffrey  Wile M.D.  On: 08/29/2019 15:05   Ct Cervical Spine Wo Contrast  Result Date: 08/29/2019 CLINICAL DATA:  Patient status post seizure today. Initial encounter. EXAM: CT CERVICAL SPINE WITHOUT CONTRAST TECHNIQUE: Multidetector CT imaging of the cervical spine was performed without intravenous contrast. Multiplanar CT image reconstructions were also generated. COMPARISON:  None. FINDINGS: Alignment: Trace retrolisthesis C5 on C6 noted. Skull base and vertebrae: No acute fracture. No primary bone lesion or focal pathologic process. Soft tissues and spinal canal: No prevertebral fluid or swelling. No visible canal hematoma. Disc levels:  Mild loss of disc space height at C5-6 and C6-7 noted. Upper chest: Lung apices clear. Other: None. IMPRESSION: No acute abnormality. Mild degenerative disc disease C5-6 and C6-7. Electronically Signed   By: Drusilla Kanner M.D.   On: 08/29/2019 15:06   Mr Brain Wo  Contrast  Result Date: 08/30/2019 CLINICAL DATA:  Seizure, new, abnormal neuro exam EXAM: MRI HEAD WITHOUT CONTRAST TECHNIQUE: Multiplanar, multiecho pulse sequences of the brain and surrounding structures were obtained without intravenous contrast. COMPARISON:  Head CT from yesterday FINDINGS: Brain: No acute infarction, hemorrhage, hydrocephalus, extra-axial collection or mass lesion. 2 or 3 remote white matter insults, nonspecific and usually considered allowable for the fifth decade. No focal cortical finding to explain seizure. Symmetric normal appearance of the hippocampus with sulcus remnant cysts on both sides. Vascular: Normal flow voids. Skull and upper cervical spine: Negative for marrow lesion Sinuses/Orbits: Patchy mucosal thickening in the ethmoid sinuses with small secretions in the dependent right maxillary sinus. Other: Mild intermittent motion degradation. IMPRESSION: No acute finding or explanation for seizure. Electronically Signed   By: Marnee Spring M.D.   On: 08/30/2019 04:50    Microbiology: Recent Results (from the past 240 hour(s))  SARS CORONAVIRUS 2 (TAT 6-24 HRS) Nasopharyngeal Nasopharyngeal Swab     Status: None   Collection Time: 08/29/19  4:37 PM   Specimen: Nasopharyngeal Swab  Result Value Ref Range Status   SARS Coronavirus 2 NEGATIVE NEGATIVE Final    Comment: (NOTE) SARS-CoV-2 target nucleic acids are NOT DETECTED. The SARS-CoV-2 RNA is generally detectable in upper and lower respiratory specimens during the acute phase of infection. Negative results do not preclude SARS-CoV-2 infection, do not rule out co-infections with other pathogens, and should not be used as the sole basis for treatment or other patient management decisions. Negative results must be combined with clinical observations, patient history, and epidemiological information. The expected result is Negative. Fact Sheet for Patients: HairSlick.no Fact Sheet for  Healthcare Providers: quierodirigir.com This test is not yet approved or cleared by the [place] FDA and  has been authorized for detection and/or diagnosis of SARS-CoV-2 by FDA under an Emergency Use Authorization (EUA). This EUA will remain  in effect (meaning this test can be used) for the duration of the COVID-19 declaration under Section 56 4(b)(1) of the Act, 21 U.S.C. section 360bbb-3(b)(1), unless the authorization is terminated or revoked sooner. Performed at [facility], [address] [place], [place] [address]      Labs: Basic Metabolic Panel: Recent Labs  Lab 08/29/19 1133 08/29/19 2056 08/30/19 0233  NA 135  --  138  K 4.4  --  3.6  CL 104  --  107  CO2 21*  --  22  GLUCOSE 106*  --  107*  BUN 11  --  8  CREATININE 1.09  --  1.10  CALCIUM 9.4  --  8.5*  MG  --  2.0  --  PHOS  --  2.0*  --    Liver Function Tests: Recent Labs  Lab 08/29/19 1133  AST 26  ALT 19  ALKPHOS 59  BILITOT 0.6  PROT 7.0  ALBUMIN 4.0   No results for input(s): LIPASE, AMYLASE in the last 168 hours. No results for input(s): AMMONIA in the last 168 hours. CBC: Recent Labs  Lab 08/29/19 1133 08/29/19 2056 08/30/19 0233  WBC 14.2* 10.5 9.6  NEUTROABS 11.8*  --   --   HGB 15.6 13.6 13.8  HCT 45.8 41.2 40.2  MCV 96.2 97.2 96.4  PLT 322 296 276   Cardiac Enzymes: Recent Labs  Lab 08/29/19 2056 08/30/19 0233  CKTOTAL 170 179   BNP: BNP (last 3 results) No results for input(s): BNP in the last 8760 hours.  ProBNP (last 3 results) No results for input(s): PROBNP in the last 8760 hours.  CBG: Recent Labs  Lab 08/29/19 1221  GLUCAP 135*       Signed:  Geradine Girt DO  [facility] 08/30/2019, 3:52 PM

## 2019-08-30 NOTE — Progress Notes (Signed)
EEG complete - results pending 

## 2019-08-30 NOTE — Procedures (Addendum)
Patient Name: Matthew Bryant  MRN: [id number]  Epilepsy Attending: Lora Havens  Referring Physician/Provider: Dr Bonnell Public Date: 08/30/2019  Duration: 24.52 mins  Patient history: 53yo M with new onset seizure. EEG to evaluate for seizure  Level of alertness: awake, drowsy  AEDs during EEG study: Ativan  Technical aspects: This EEG study was done with scalp electrodes positioned according to the 10-20 International system of electrode placement. Electrical activity was acquired at a sampling rate of 500Hz  and reviewed with a high frequency filter of 70Hz  and a low frequency filter of 1Hz . EEG data were recorded continuously and digitally stored.   DESCRIPTION:The posterior dominant rhythm consists of 8-9 Hz activity of moderate voltage (25-35 uV) seen predominantly in posterior head regions, symmetric and reactive to eye opening and eye closing.   Drowsiness was characterized by attenuation of the posterior background rhythm. Intermittent generalized 2-4Hz  delta slowing was noted. Physiologic photic driving was seen during photic stimulation. Hyperventilation was not performed.   ABNORMALITY: 1. Intermittent slow, generalized  IMPRESSION: This study is suggestive of mild diffuse encephalopathy, nonspecific to etiology.  No seizures or epileptiform discharges were seen throughout the recording.  Matthew Bryant

## 2019-08-30 NOTE — Progress Notes (Signed)
Pt tolerated ambulation well with no dizziness or gait disturbances.     08/30/19 1240  Mobility  Activity Ambulated in hall  Range of Motion Active;All extremities  Level of Assistance Independent  Assistive Device None  Distance Ambulated (ft) 300 ft  Mobility Response Tolerated well

## 2019-08-30 NOTE — Progress Notes (Signed)
Pt given discharge instructions and care notes. Pt verbalized understanding AEB no further questions or concerns at this time. IV was discontinued, no redness, pain, or swelling noted at this time. Telemetry discontinued and Centralized Telemetry was notified. Pt left the floor ambulatory with staff in stable condition.

## 2019-08-30 NOTE — Progress Notes (Signed)
MRI brain: No acute finding or explanation for seizure.  EEG: Pending  Electronically signed: Dr. Kerney Elbe

## 2019-08-30 NOTE — TOC Initial Note (Signed)
Transition of Care [facility]) - Initial/Assessment Note    Patient Details  Name: Matthew Bryant MRN: [id number] Date of Birth: 1966/08/07  Transition of Care [facility]) CM/SW Contact:    Ralph Dowdy, [place] Work Phone Number: 08/30/2019, 4:30 PM  Clinical Narrative:                 CSW did a thorough assessment on pt. CSW talked with pt about family supports, his substance use, his struggle and goals. Pt reported to have his drinking under control and he stated that it is fairly easy for him to stop drinking. Pt was agreeable to have a follow up appointment at the [facility] in [place]. Pt was also agreeable to the community resources that the CSW gave him on substance use if his drinking ever got out of hand. Counseling resources were also given to client about the grief that he has been facing.        Patient Goals and CMS Choice        Expected Discharge Plan and Services           Expected Discharge Date: 08/30/19                                    Prior Living Arrangements/Services                       Activities of Daily Living Home Assistive Devices/Equipment: None ADL Screening (condition at time of admission) Patient's cognitive ability adequate to safely complete daily activities?: Yes Is the patient deaf or have difficulty hearing?: No Does the patient have difficulty seeing, even when wearing glasses/contacts?: No Does the patient have difficulty concentrating, remembering, or making decisions?: No Patient able to express need for assistance with ADLs?: Yes Does the patient have difficulty dressing or bathing?: No Independently performs ADLs?: Yes (appropriate for developmental age) Does the patient have difficulty walking or climbing stairs?: No Weakness of Legs: None Weakness of Arms/Hands: None  Permission Sought/Granted                  Emotional Assessment              Admission diagnosis:  Seizure  ([place]) [R56.9] Laceration of tongue, initial encounter [S01.512A] Patient Active Problem List   Diagnosis Date Noted  . ETOH abuse   . Substance abuse ([place])   . New onset seizure ([place]) 08/29/2019  . Abscess of right thigh 12/03/2015  . Cellulitis 12/02/2015  . Infected blister of right thigh 12/02/2015  . Blister of thigh with infection 12/02/2015   PCP:  Patient, No Pcp Per Pharmacy:   [facility] [address], [place] [address] Phone: [phone] Fax: [phone]     Social Determinants of Health (SDOH) Interventions    Readmission Risk Interventions No flowsheet data found.

## 2019-09-19 ENCOUNTER — Inpatient Hospital Stay: Payer: Self-pay | Admitting: Critical Care Medicine

## 2019-09-19 NOTE — Progress Notes (Deleted)
   Subjective:    Patient ID: Matthew Bryant, male    DOB: Apr 29, 1966, 53 y.o.   MRN: [id number]  Admit date: 08/29/2019 Discharge date: 08/30/2019  Time spent: 45 minutes  Recommendations for Outpatient Follow-up:   1. Once established at PCP-- referral to counseling and neurology for follow up    Discharge Diagnoses:  Principal Problem:   New onset seizure [facility]) Active Problems:   ETOH abuse   Substance abuse ([place])   Discharge Condition: stable  Diet recommendation: regular  Filed Weights  08/29/19 2027 Weight: 83.1 kg   History of present illness:  Matthew Secrestis an 53 y.o.malewho has no known past medical history however admits to never going to see physicians was in his usual state of health until 9/23 when he fell off a ladder secondary to seizure. History as per patient, his mother as well as review of notes in the chart.  Patient apparently was on a ladder 12 to 15 feet off the ground when he was found on the ground with a tonic-clonic seizure. Apparently patient was postictal when he came to. Patient  denied any known history of seizures. He does however admit to feeling quite depressed over the past 6 months to a year due to loss of father, stepfather as well as difficulties with his jobs. Patient admited to increased alcohol use as well as ongoing marijuana use until 4 days prior when  he stopped both marijuana and alcohol. Patient stated he  drinks 4 20oz beers a day however this is different from what he is told other providers. He admited that he had been drinking more as a way to "take the edge off". Patient does admit to depressed mood and increased irritability. He has no suicidal ideation or plans. He noted he does not have a PCP or psychiatrist and he did take 1 of his wife's antidepressants however "it knocked me out and I could not function".    Hospital Course:  NEW ONSET SEIZURE. 53 year old with first seizure  very possibly  secondary to cessation of alcohol. MRI brain with no acute finding. EEG with diffuse slowing CT head no acute finding. CT neck with mild degenerative disc.  Evaluated by neuro who opined likely related to cessation of  ETOH.   ALCOHOL USE Patient is not really giving a clear answer as to how much alcohol and marijuana he uses. No s/sx withdrawal. At time of discharge admitted to "3 or 4 20oz beers daily".   DEPRESSED MOODPatient clearly has a depressed mood and probably meets criteria for depression. Denies SI.  He has no PCP. Care management for OP follow up and counseling   [place] Patient had significant fall from a ladder, he will likely have muscle spasms over the next couple of days. CK within limits of normal x2.  Ambulated in hall       Review of Systems     Objective:   Physical Exam        Assessment & Plan:

## 2020-08-05 IMAGING — MR MR HEAD W/O CM
14 of 15 series · 44 of 48 positions shown · non-contrast
Comparison: Head CT from yesterday

CLINICAL DATA: Seizure, new, abnormal neuro exam

EXAM:
MRI HEAD WITHOUT CONTRAST
TECHNIQUE: Multiplanar, multiecho pulse sequences of the brain and surrounding
structures were obtained without intravenous contrast.

[Series 5: DWI · axial · 3.0mm · 0.88mm/px · z∈[-115,+32]mm · 6 of 100 slices shown (1 of 4)]
[im 1/100]
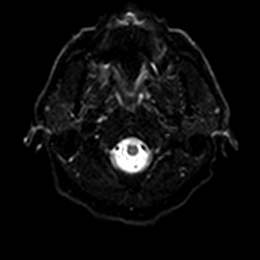
[im 20/100]
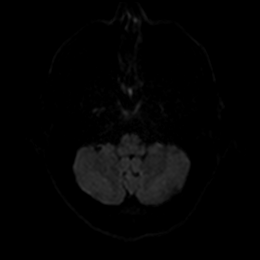
[im 40/100]
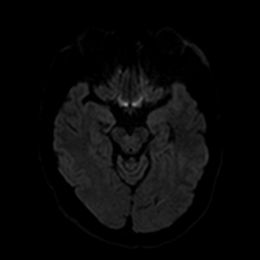
[im 60/100]
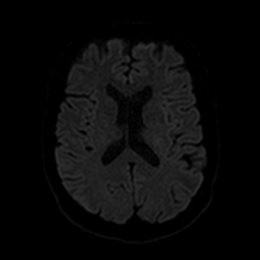
[im 80/100]
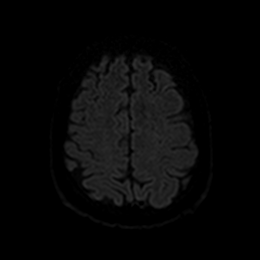
[im 100/100]
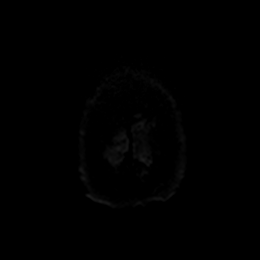

[Series 6: DWI · axial · 3.0mm · 0.88mm/px · z∈[-115,+32]mm · 3 of 49 slices shown (2 of 4)]
[im 1/49]
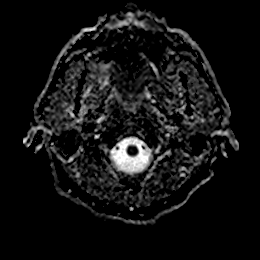
[im 25/49]
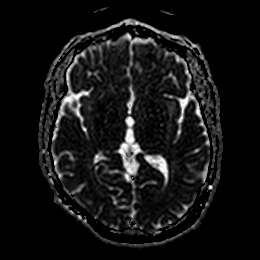
[im 49/49]
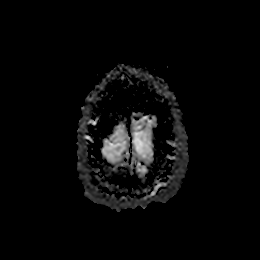

[Series 7: DWI · coronal · 4.0mm · 0.88mm/px · 5 of 74 slices shown (3 of 4)]
[im 1/74]
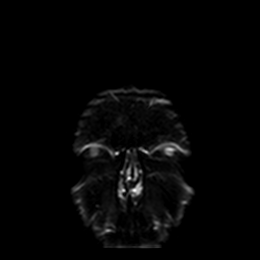
[im 19/74]
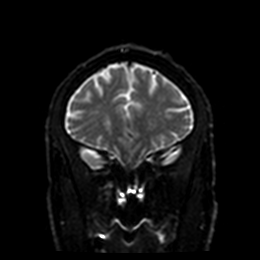
[im 37/74]
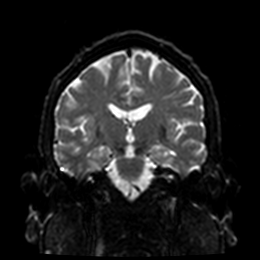
[im 55/74]
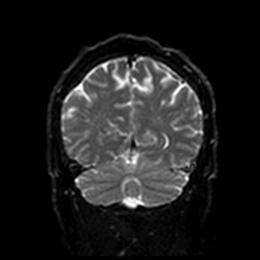
[im 74/74]
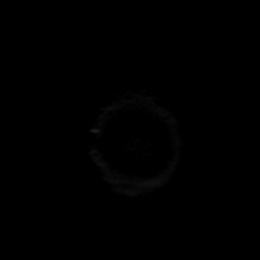

[Series 8: DWI · coronal · 4.0mm · 0.88mm/px · 2 of 37 slices shown (4 of 4)]
[im 1/37]
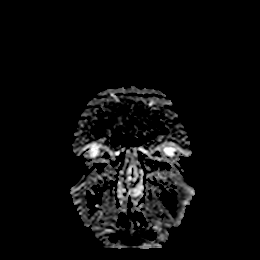
[im 37/37]
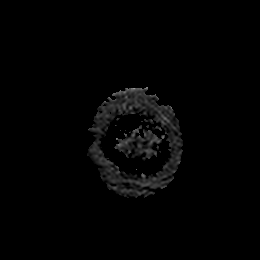

[Series 9: T1 · sagittal · 5.0mm · 0.75mm/px · 2 of 25 slices shown]
[im 1/25]
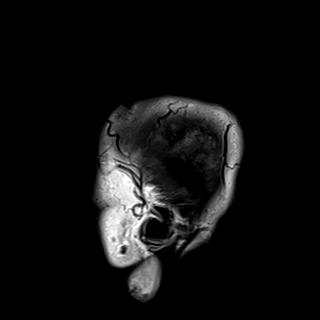
[im 25/25]
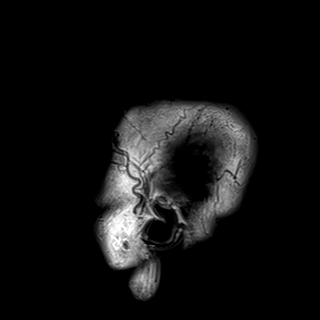

[Series 10: T2 · axial · 5.0mm · 0.72mm/px · z∈[-116,+33]mm · 2 of 26 slices shown (1 of 3)]
[im 1/26]
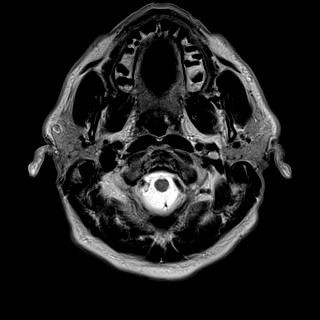
[im 26/26]
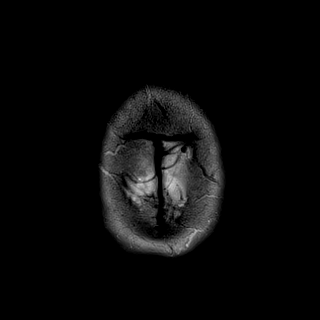

[Series 11: FLAIR · axial · 5.0mm · 0.45mm/px · z∈[-116,+33]mm · 2 of 26 slices shown (1 of 2)]
[im 1/26]
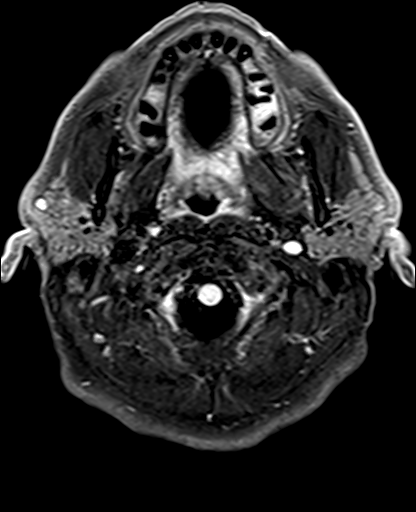
[im 26/26]
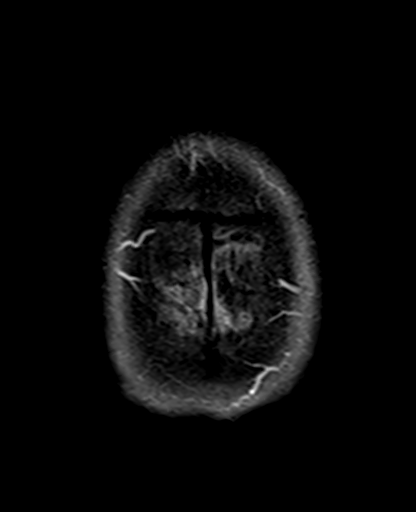

[Series 12: mag_images · axial · 3.0mm · 0.90mm/px · z∈[-130,+57]mm · 4 of 64 slices shown]
[im 1/64]
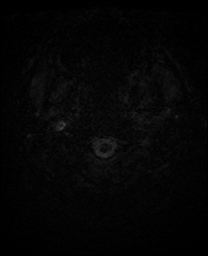
[im 22/64]
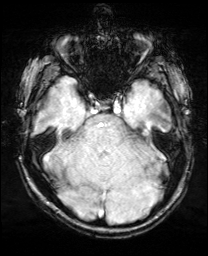
[im 43/64]
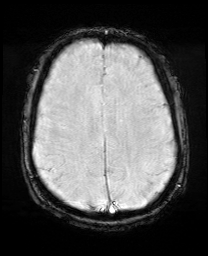
[im 64/64]
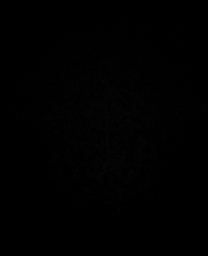

[Series 13: pha_images · axial · 3.0mm · 0.90mm/px · z∈[-130,+54]mm · 4 of 62 slices shown]
[im 1/62]
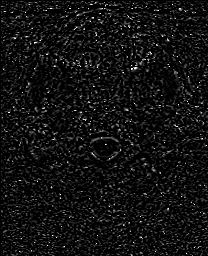
[im 21/62]
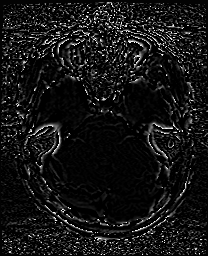
[im 41/62]
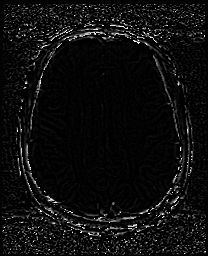
[im 62/62]
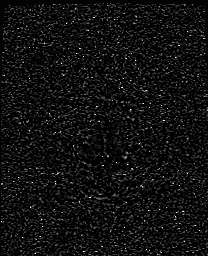

[Series 14: swi_images · axial · 3.0mm · 0.90mm/px · z∈[-130,+57]mm · 4 of 64 slices shown]
[im 1/64]
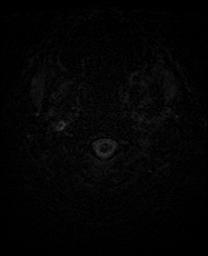
[im 22/64]
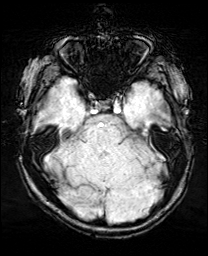
[im 43/64]
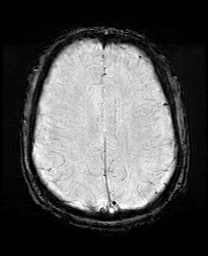
[im 64/64]
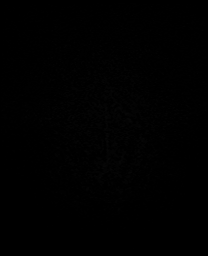

[Series 15: mip_images(sw) · axial · 24.0mm · 0.90mm/px · z∈[-120,+47]mm · 4 of 57 slices shown]
[im 1/57]
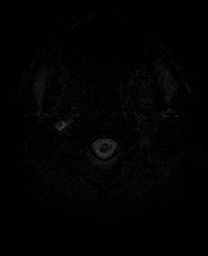
[im 19/57]
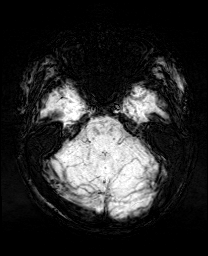
[im 38/57]
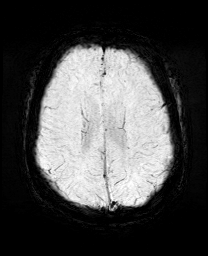
[im 57/57]
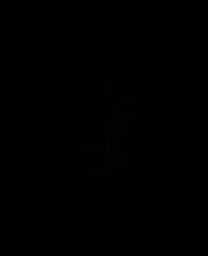

[Series 17: T2 · oblique · 3.0mm · 0.27mm/px · 2 of 29 slices shown (2 of 3)]
[im 1/29]
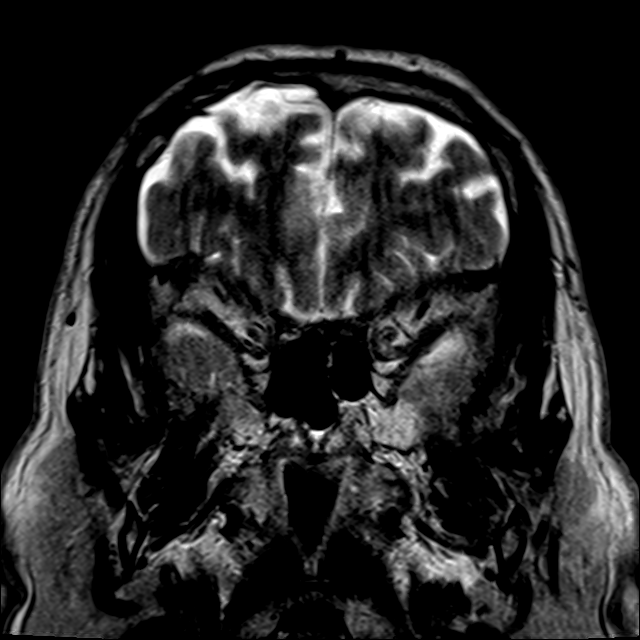
[im 29/29]
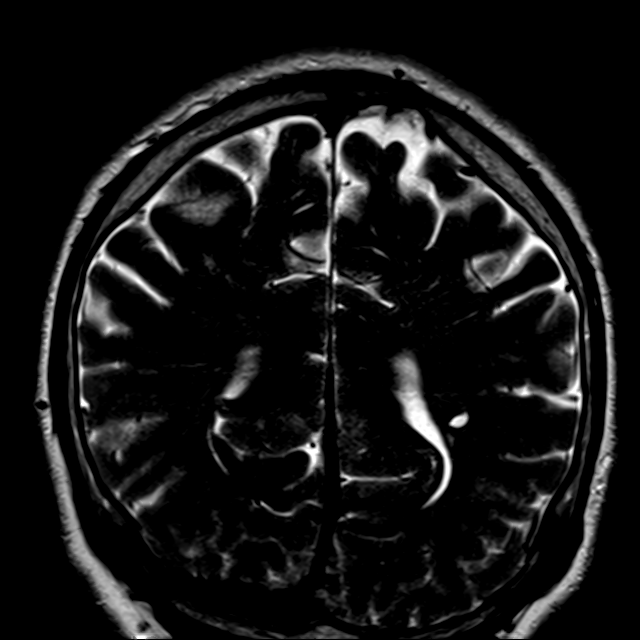

[Series 18: FLAIR · oblique · 3.0mm · 0.56mm/px · 2 of 29 slices shown (2 of 2)]
[im 1/29]
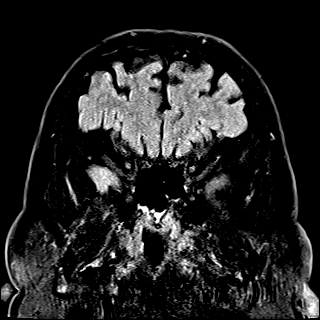
[im 29/29]
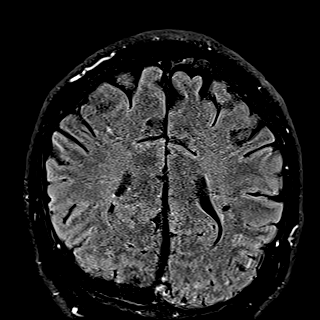

[Series 19: T2 · coronal · 5.0mm · 0.34mm/px · 2 of 30 slices shown (3 of 3)]
[im 1/30]
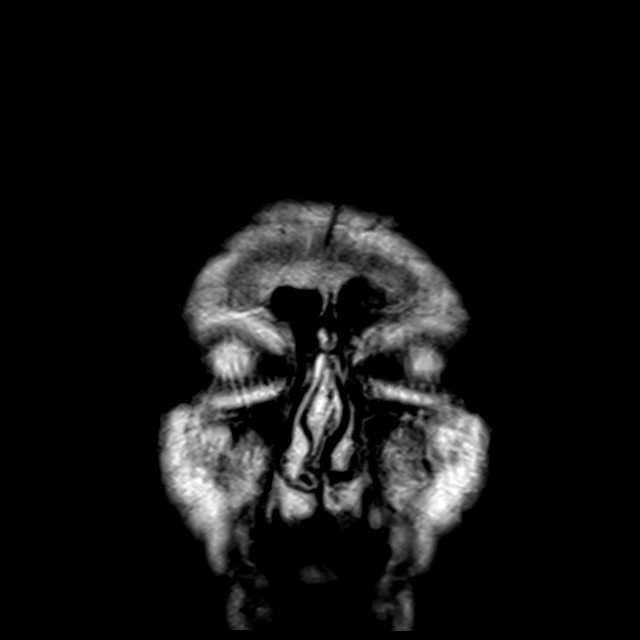
[im 30/30]
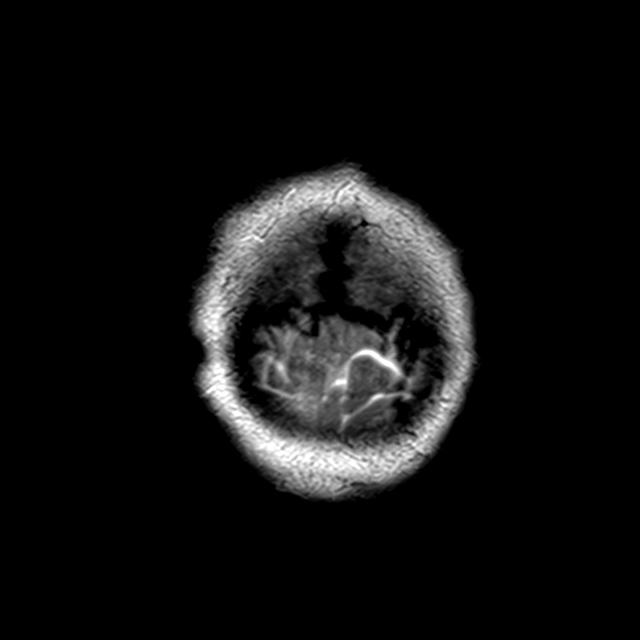

[44 of 48 positions shown; findings below may reference images not displayed]

FINDINGS: Brain: No acute infarction, hemorrhage, hydrocephalus, extra-axial
collection or mass lesion. 2 or 3 remote white matter insults,
nonspecific and usually considered allowable for the fifth decade.
No focal cortical finding to explain seizure. Symmetric normal
appearance of the hippocampus with sulcus remnant cysts on both
sides.

Vascular: Normal flow voids.

Skull and upper cervical spine: Negative for marrow lesion

Sinuses/Orbits: Patchy mucosal thickening in the ethmoid sinuses
with small secretions in the dependent right maxillary sinus.

Other: Mild intermittent motion degradation.
IMPRESSION: No acute finding or explanation for seizure.

## 2022-05-05 ENCOUNTER — Encounter: Payer: Self-pay | Admitting: Neurology

## 2022-05-25 ENCOUNTER — Ambulatory Visit (INDEPENDENT_AMBULATORY_CARE_PROVIDER_SITE_OTHER): Payer: Self-pay | Admitting: Neurology

## 2022-05-25 ENCOUNTER — Encounter: Payer: Self-pay | Admitting: Neurology

## 2022-05-25 VITALS — BP 154/100 | HR 54 | Ht 70.0 in | Wt 183.0 lb

## 2022-05-25 DIAGNOSIS — R569 Unspecified convulsions: Secondary | ICD-10-CM

## 2022-05-25 MED ORDER — LAMOTRIGINE 25 MG PO TABS: ORAL_TABLET | ORAL | 11 refills | Status: DC
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
## 2022-08-30 ENCOUNTER — Ambulatory Visit: Payer: Self-pay | Admitting: Neurology

## 2022-09-07 ENCOUNTER — Encounter: Payer: Self-pay | Admitting: Neurology

## 2022-09-07 ENCOUNTER — Ambulatory Visit (INDEPENDENT_AMBULATORY_CARE_PROVIDER_SITE_OTHER): Payer: Self-pay | Admitting: Neurology

## 2022-09-07 VITALS — BP 153/93 | HR 48 | Ht 70.0 in | Wt 193.0 lb

## 2022-09-07 DIAGNOSIS — R569 Unspecified convulsions: Secondary | ICD-10-CM

## 2022-09-07 MED ORDER — LAMOTRIGINE 25 MG PO TABS: ORAL_TABLET | ORAL | 11 refills | Status: DC
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
## 2022-09-07 NOTE — Patient Instructions (Signed)
Good to see you doing well. Please increase the Lamotrigine 25mg  to take 1 tablet in AM, 1 tablet in PM. If no side effects in 2 weeks, please increase to 2 tablets in AM, 2 tablets in PM.  Call your PCP to discuss BP changes and also depression.  Follow-up in 3 months, call for any changes.   Seizure Precautions: 1. If medication has been prescribed for you to prevent seizures, take it exactly as directed.  Do not stop taking the medicine without talking to your doctor first, even if you have not had a seizure in a long time.   2. Avoid activities in which a seizure would cause danger to yourself or to others.  Don't operate dangerous machinery, swim alone, or climb in high or dangerous places, such as on ladders, roofs, or girders.  Do not drive unless your doctor says you may.  3. If you have any warning that you may have a seizure, lay down in a safe place where you can't hurt yourself.    4.  No driving for 6 months from last seizure, as per [facility].   Please refer to the following link on the [place] website for more information: http://www.epilepsyfoundation.org/answerplace/Social/driving/drivingu.cfm   5.  Maintain good sleep hygiene. Continue reducing alcohol intake.  6.  Contact your doctor if you have any problems that may be related to the medicine you are taking.  7.  Call 911 and bring the patient back to the ED if:        A.  The seizure lasts longer than 5 minutes.       B.  The patient doesn't awaken shortly after the seizure  C.  The patient has new problems such as difficulty seeing, speaking or moving  D.  The patient was injured during the seizure  E.  The patient has a temperature over 102 F (39C)  F.  The patient vomited and now is having trouble breathing

## 2022-12-20 ENCOUNTER — Ambulatory Visit (INDEPENDENT_AMBULATORY_CARE_PROVIDER_SITE_OTHER): Payer: Self-pay | Admitting: Neurology

## 2022-12-20 ENCOUNTER — Encounter: Payer: Self-pay | Admitting: Neurology

## 2022-12-20 VITALS — BP 126/81 | HR 51 | Ht 70.0 in | Wt 189.8 lb

## 2022-12-20 DIAGNOSIS — R569 Unspecified convulsions: Secondary | ICD-10-CM

## 2022-12-20 MED ORDER — LAMOTRIGINE 25 MG PO TABS: ORAL_TABLET | ORAL | 11 refills | Status: AC
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
## 2022-12-20 NOTE — Patient Instructions (Signed)
Good to see you doing well. Continue Lamotrigine 25mg : take 2 tablets twice a day. Continue working on slowly getting off alcohol and Xanax, do not stop suddenly. Follow-up in 6 months, call for any changes.    Seizure Precautions: 1. If medication has been prescribed for you to prevent seizures, take it exactly as directed.  Do not stop taking the medicine without talking to your doctor first, even if you have not had a seizure in a long time.   2. Avoid activities in which a seizure would cause danger to yourself or to others.  Don't operate dangerous machinery, swim alone, or climb in high or dangerous places, such as on ladders, roofs, or girders.  Do not drive unless your doctor says you may.  3. If you have any warning that you may have a seizure, lay down in a safe place where you can't hurt yourself.    4.  No driving for 6 months from last seizure, as per [facility].   Please refer to the following link on the [place] website for more information: http://www.epilepsyfoundation.org/answerplace/Social/driving/drivingu.cfm   5.  Maintain good sleep hygiene.  6.  Contact your doctor if you have any problems that may be related to the medicine you are taking.  7.  Call 911 and bring the patient back to the ED if:        A.  The seizure lasts longer than 5 minutes.       B.  The patient doesn't awaken shortly after the seizure  C.  The patient has new problems such as difficulty seeing, speaking or moving  D.  The patient was injured during the seizure  E.  The patient has a temperature over 102 F (39C)  F.  The patient vomited and now is having trouble breathing

## 2023-07-18 ENCOUNTER — Encounter: Payer: Self-pay | Admitting: Neurology

## 2023-07-18 ENCOUNTER — Ambulatory Visit: Payer: Self-pay | Admitting: Neurology

## 2023-07-18 DIAGNOSIS — Z029 Encounter for administrative examinations, unspecified: Secondary | ICD-10-CM
